# Patient Record
Sex: Male | Born: 1960 | Race: White | Hispanic: No | Marital: Married | Smoking: Never smoker
Health system: Southern US, Community
[De-identification: ages and names within clinical notes are randomized; demographics above are authoritative.]

## PROBLEM LIST (undated history)

## (undated) DIAGNOSIS — T7840XA Allergy, unspecified, initial encounter: Secondary | ICD-10-CM

## (undated) DIAGNOSIS — R945 Abnormal results of liver function studies: Secondary | ICD-10-CM

## (undated) DIAGNOSIS — C44300 Unspecified malignant neoplasm of skin of unspecified part of face: Secondary | ICD-10-CM

## (undated) DIAGNOSIS — R202 Paresthesia of skin: Secondary | ICD-10-CM

## (undated) DIAGNOSIS — I10 Essential (primary) hypertension: Secondary | ICD-10-CM

## (undated) DIAGNOSIS — S0291XA Unspecified fracture of skull, initial encounter for closed fracture: Secondary | ICD-10-CM

## (undated) DIAGNOSIS — E7800 Pure hypercholesterolemia, unspecified: Secondary | ICD-10-CM

## (undated) DIAGNOSIS — F32A Depression, unspecified: Secondary | ICD-10-CM

## (undated) DIAGNOSIS — G4730 Sleep apnea, unspecified: Secondary | ICD-10-CM

## (undated) DIAGNOSIS — M1990 Unspecified osteoarthritis, unspecified site: Secondary | ICD-10-CM

## (undated) HISTORY — DX: Essential (primary) hypertension: I10

## (undated) HISTORY — DX: Allergy, unspecified, initial encounter: T78.40XA

## (undated) HISTORY — DX: Paresthesia of skin: R20.2

## (undated) HISTORY — DX: Unspecified osteoarthritis, unspecified site: M19.90

## (undated) HISTORY — DX: Depression, unspecified: F32.A

## (undated) HISTORY — PX: VASECTOMY: SHX75

## (undated) HISTORY — PX: FRACTURE SURGERY: SHX138

---

## 2001-10-30 HISTORY — PX: TIBIA FRACTURE SURGERY: SHX806

## 2002-11-02 ENCOUNTER — Inpatient Hospital Stay (HOSPITAL_COMMUNITY): Admission: EM | Admit: 2002-11-02 | Discharge: 2002-11-05 | Payer: Self-pay | Admitting: Emergency Medicine

## 2002-11-03 ENCOUNTER — Encounter: Payer: Self-pay | Admitting: Orthopedic Surgery

## 2010-02-04 ENCOUNTER — Ambulatory Visit: Payer: Self-pay | Admitting: Diagnostic Radiology

## 2010-02-04 ENCOUNTER — Encounter: Payer: Self-pay | Admitting: Emergency Medicine

## 2010-02-05 ENCOUNTER — Inpatient Hospital Stay (HOSPITAL_COMMUNITY): Admission: EM | Admit: 2010-02-05 | Discharge: 2010-02-06 | Payer: Self-pay | Admitting: Emergency Medicine

## 2011-01-18 LAB — DIFFERENTIAL
Basophils Absolute: 0.1 10*3/uL (ref 0.0–0.1)
Basophils Relative: 1 % (ref 0–1)
Eosinophils Absolute: 0 10*3/uL (ref 0.0–0.7)
Eosinophils Relative: 0 % (ref 0–5)
Lymphocytes Relative: 13 % (ref 12–46)
Lymphs Abs: 1.5 10*3/uL (ref 0.7–4.0)
Monocytes Absolute: 0.5 10*3/uL (ref 0.1–1.0)
Monocytes Relative: 4 % (ref 3–12)
Neutro Abs: 9.4 10*3/uL — ABNORMAL HIGH (ref 1.7–7.7)
Neutrophils Relative %: 82 % — ABNORMAL HIGH (ref 43–77)

## 2011-01-18 LAB — CBC
HCT: 42.5 % (ref 39.0–52.0)
Hemoglobin: 14.3 g/dL (ref 13.0–17.0)
MCHC: 33.7 g/dL (ref 30.0–36.0)
MCV: 89.8 fL (ref 78.0–100.0)
Platelets: 239 10*3/uL (ref 150–400)
RBC: 4.73 MIL/uL (ref 4.22–5.81)
RDW: 11.7 % (ref 11.5–15.5)
WBC: 11.4 10*3/uL — ABNORMAL HIGH (ref 4.0–10.5)

## 2011-01-18 LAB — BASIC METABOLIC PANEL
BUN: 16 mg/dL (ref 6–23)
CO2: 26 mEq/L (ref 19–32)
Calcium: 8.4 mg/dL (ref 8.4–10.5)
Chloride: 104 mEq/L (ref 96–112)
Creatinine, Ser: 1 mg/dL (ref 0.4–1.5)
GFR calc Af Amer: >60 mL/min (ref >60)
GFR calc non Af Amer: >60 mL/min (ref >60)
Glucose, Bld: 135 mg/dL — ABNORMAL HIGH (ref 70–99)
Potassium: 4.1 mEq/L (ref 3.5–5.1)
Sodium: 145 mEq/L (ref 135–145)

## 2013-08-24 ENCOUNTER — Ambulatory Visit (INDEPENDENT_AMBULATORY_CARE_PROVIDER_SITE_OTHER): Payer: 59 | Admitting: Family Medicine

## 2013-08-24 ENCOUNTER — Ambulatory Visit: Payer: 59

## 2013-08-24 VITALS — BP 130/80 | HR 77 | Temp 98.3°F | Resp 18 | Ht 67.5 in | Wt 272.0 lb

## 2013-08-24 DIAGNOSIS — Z131 Encounter for screening for diabetes mellitus: Secondary | ICD-10-CM

## 2013-08-24 DIAGNOSIS — M25551 Pain in right hip: Secondary | ICD-10-CM

## 2013-08-24 DIAGNOSIS — M1610 Unilateral primary osteoarthritis, unspecified hip: Secondary | ICD-10-CM

## 2013-08-24 DIAGNOSIS — M25559 Pain in unspecified hip: Secondary | ICD-10-CM

## 2013-08-24 DIAGNOSIS — R200 Anesthesia of skin: Secondary | ICD-10-CM

## 2013-08-24 DIAGNOSIS — R209 Unspecified disturbances of skin sensation: Secondary | ICD-10-CM

## 2013-08-24 LAB — POCT GLYCOSYLATED HEMOGLOBIN (HGB A1C): Hemoglobin A1C: 5.9

## 2013-08-24 MED ORDER — MELOXICAM 15 MG PO TABS: 15.0000 mg | ORAL_TABLET | Freq: Every day | ORAL | Status: DC

## 2013-08-24 NOTE — Progress Notes (Signed)
[facility]:  Office Visit  Chief Complaint:  Chief Complaint  Patient presents with  . Hip Pain    (R) hip ongoing x 1 year    HPI: Rick Stokes is a 52 y.o. male who is here for right hip pain x 1 year, near groin area, may have been triggered or gotten worse after digging tree in backyard about 1 year ago. He has pain when puts weight on it, if he moves a certain position, epecially when he crosses his right leg over the other one. IT can be a sharp pain when it happens, 7-8/10. He really has not tried anything for this. He has had no prior injuries. There was really no known injury to the hip that he knows of. He did not have any hip problems as a child He does not take chronic steroids, he has had no prior hip injuries. However in 2003-2004, he had a Right tibia and fibula compound fracture, rods and screws were put in after an accident when he fell from the top of a truck to the truck bed/bottome of it.  Denies any back or knee pain currently. He has no weakness, he does have some numbness and tingling in his hands. They are usually just the middle 3 fingers, when he sleeps mostly . He does not get them during the day. He works with his hands a lot. He denies prior h/o carpal tunnel syndrome, denies neck pain or shoulder pain Denies diabetes  Past Medical History  Diagnosis Date  . Allergy   . Arthritis   . Cancer     Skin  . Hypertension    Past Surgical History  Procedure Laterality Date  . Vasectomy    . Fracture surgery     History   Social History  . Marital Status: Married    Spouse Name: N/A    Number of Children: N/A  . Years of Education: N/A   Social History Main Topics  . Smoking status: Never Smoker   . Smokeless tobacco: None  . Alcohol Use: 7.0 oz/week    14 drink(s) per week  . Drug Use: No  . Sexual Activity: None   Other Topics Concern  . None   Social History Narrative  . None   Family History  Problem Relation Age  of Onset  . Bone cancer Mother   . Heart attack Father   . Dementia Maternal Grandmother    No Known Allergies Prior to Admission medications   Medication Sig Start Date End Date Taking? Authorizing Provider  fish oil-omega-3 fatty acids 1000 MG capsule Take 2 g by mouth daily.   Yes Historical Provider, MD  Garlic 500 MG CAPS Take by mouth.   Yes Historical Provider, MD     ROS: The patient denies fevers, chills, night sweats, unintentional weight loss, chest pain, palpitations, wheezing, dyspnea on exertion, nausea, vomiting, abdominal pain, dysuria, hematuria, melena,weakness  All other systems have been reviewed and were otherwise negative with the exception of those mentioned in the HPI and as above.    PHYSICAL EXAM: Filed Vitals:   08/24/13 0818  BP: 130/80  Pulse: 77  Temp: 98.3 F (36.8 C)  Resp: 18   Filed Vitals:   08/24/13 0818  Height: 5' 7.5" (1.715 m)  Weight: 272 lb (123.378 kg)   Body mass index is 41.95 kg/(m^2).  General: Alert, no acute distress HEENT:  Normocephalic, atraumatic, oropharynx patent. EOMI, PERRLA Cardiovascular:  Regular rate  and rhythm, no rubs murmurs or gallops.  No Carotid bruits, radial pulse intact. No pedal edema.  Respiratory: Clear to auscultation bilaterally.  No wheezes, rales, or rhonchi.  No cyanosis, no use of accessory musculature GI: No organomegaly, abdomen is soft and non-tender, positive bowel sounds.  No masses. Skin: No rashes. Neurologic: Facial musculature symmetric. Psychiatric: Patient is appropriate throughout our interaction. Lymphatic: No cervical lymphadenopathy Musculoskeletal: Gait intact. Neck-nl Back-nl Knee-nl Right hip- nl rom, pain with adduction, pain with full IR/ER 5/5 strenght, sensation intact, 2/2 DTRs knee and ankle + DP Wrist-+ tinels bilaterally, neg phalens, neg dequervain, full ROM, sensation intact, 5/5 strength   LABS: Results for orders placed in visit on 08/24/13  POCT  GLYCOSYLATED HEMOGLOBIN (HGB A1C)      Result Value Range   Hemoglobin A1C 5.9       EKG/XRAY:   Primary read interpreted by Dr. Conley Rolls at [facility]. + arthritis No fx or dislocation   ASSESSMENT/PLAN: Encounter Diagnoses  Name Primary?  . Right hip pain Yes  . Screening for diabetes mellitus   . Numbness and tingling in hands   . Hip arthritis    Rx Mobic  Weightloss NO DM for cause of hand numbness/tingling, maybe carpal tunnel realted Will await for official xrays, there are some incongruent arthritic changes and sclerotic areas on lateral femoral head F/u in 3 months or prn Gross sideeffects, risk and benefits, and alternatives of medications d/w patient. Patient is aware that all medications have potential sideeffects and we are unable to predict every sideeffect or drug-drug interaction that may occur.  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  This may be due to subacute remodeling / collapse or osteochondral  defect. No definite acute fracture. Clinical correlation is  necessary. Further correlation with MRI is recommended.

## 2013-08-24 NOTE — Patient Instructions (Signed)

## 2013-08-25 ENCOUNTER — Telehealth: Payer: Self-pay | Admitting: Family Medicine

## 2013-08-25 ENCOUNTER — Telehealth: Payer: Self-pay

## 2013-08-25 NOTE — Telephone Encounter (Signed)
Unable to leave voice mail at pt's home or cell phone. LM with wife's cell phone.  Appt for ortho with Dr. 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

## 2013-08-25 NOTE — Telephone Encounter (Signed)
Patient was seen by Dr Conley Rolls and was referred to Dr. Dion Saucier. They cancelled the appt because patient works out of town. Just wanted to let the Dr know so she wont be expecting office notes. Patient wife called.

## 2013-08-28 ENCOUNTER — Encounter: Payer: Self-pay | Admitting: Family Medicine

## 2014-10-30 DIAGNOSIS — R7989 Other specified abnormal findings of blood chemistry: Secondary | ICD-10-CM

## 2014-10-30 HISTORY — DX: Other specified abnormal findings of blood chemistry: R79.89

## 2014-11-08 ENCOUNTER — Ambulatory Visit (INDEPENDENT_AMBULATORY_CARE_PROVIDER_SITE_OTHER): Payer: 59

## 2014-11-08 ENCOUNTER — Other Ambulatory Visit: Payer: Self-pay | Admitting: Emergency Medicine

## 2014-11-08 ENCOUNTER — Ambulatory Visit (INDEPENDENT_AMBULATORY_CARE_PROVIDER_SITE_OTHER): Payer: 59 | Admitting: Emergency Medicine

## 2014-11-08 VITALS — BP 124/80 | HR 70 | Temp 98.2°F | Resp 18 | Ht 67.5 in | Wt 250.0 lb

## 2014-11-08 DIAGNOSIS — R9431 Abnormal electrocardiogram [ECG] [EKG]: Secondary | ICD-10-CM

## 2014-11-08 DIAGNOSIS — Z01818 Encounter for other preprocedural examination: Secondary | ICD-10-CM

## 2014-11-08 DIAGNOSIS — G4733 Obstructive sleep apnea (adult) (pediatric): Secondary | ICD-10-CM

## 2014-11-08 LAB — POCT UA - MICROSCOPIC ONLY
Bacteria, U Microscopic: NEGATIVE
CASTS, UR, LPF, POC: NEGATIVE
CRYSTALS, UR, HPF, POC: NEGATIVE
EPITHELIAL CELLS, URINE PER MICROSCOPY: NEGATIVE
RBC, URINE, MICROSCOPIC: NEGATIVE
Yeast, UA: NEGATIVE

## 2014-11-08 LAB — POCT CBC
Granulocyte percent: 63.4 %G (ref 37–80)
HEMATOCRIT: 46.6 % (ref 43.5–53.7)
HEMOGLOBIN: 15.8 g/dL (ref 14.1–18.1)
LYMPH, POC: 2 (ref 0.6–3.4)
MCH: 31.8 pg — AB (ref 27–31.2)
MCHC: 33.9 g/dL (ref 31.8–35.4)
MCV: 93.8 fL (ref 80–97)
MID (cbc): 0.4 (ref 0–0.9)
MPV: 7.1 fL (ref 0–99.8)
PLATELET COUNT, POC: 184 10*3/uL (ref 142–424)
POC Granulocyte: 4.2 (ref 2–6.9)
POC LYMPH %: 29.9 % (ref 10–50)
POC MID %: 6.7 % (ref 0–12)
RBC: 4.96 M/uL (ref 4.69–6.13)
RDW, POC: 12.3 %
WBC: 6.6 10*3/uL (ref 4.6–10.2)

## 2014-11-08 LAB — COMPLETE METABOLIC PANEL WITH GFR
ALBUMIN: 4.3 g/dL (ref 3.5–5.2)
ALK PHOS: 55 U/L (ref 39–117)
ALT: 86 U/L — AB (ref 0–53)
AST: 63 U/L — AB (ref 0–37)
BUN: 11 mg/dL (ref 6–23)
CO2: 25 mEq/L (ref 19–32)
Calcium: 9.4 mg/dL (ref 8.4–10.5)
Chloride: 102 mEq/L (ref 96–112)
Creat: 0.91 mg/dL (ref 0.50–1.35)
GFR, Est African American: >89 mL/min
GFR, Est Non African American: >89 mL/min
Glucose, Bld: 103 mg/dL — ABNORMAL HIGH (ref 70–99)
POTASSIUM: 4.2 meq/L (ref 3.5–5.3)
Sodium: 138 mEq/L (ref 135–145)
Total Bilirubin: 1 mg/dL (ref 0.2–1.2)
Total Protein: 7.2 g/dL (ref 6.0–8.3)

## 2014-11-08 LAB — POCT URINALYSIS DIPSTICK
Bilirubin, UA: NEGATIVE
Blood, UA: NEGATIVE
Glucose, UA: NEGATIVE
LEUKOCYTES UA: NEGATIVE
Nitrite, UA: NEGATIVE
PH UA: 6
PROTEIN UA: NEGATIVE
Spec Grav, UA: 1.02
UROBILINOGEN UA: 0.2

## 2014-11-08 LAB — PREALBUMIN: Prealbumin: 39.1 mg/dL — ABNORMAL HIGH (ref 17.0–34.0)

## 2014-11-08 NOTE — Progress Notes (Addendum)
Subjective:  This chart was scribed for Nena Jordan, MD by Dellis Filbert, ED Scribe at [facility].The patient was seen in exam room 13 and the patient's care was started at 8:33 AM.   Patient ID: Rick Stokes, male    DOB: 04-Jan-1961, 54 y.o.   MRN: [id number] Chief Complaint  Patient presents with  . Medical Clearance    Needs pre-op clearance for hip replacement    HPI HPI Comments: Rick Stokes is a 54 y.o. male who presents to [facility] for surgical medical clearance. He will have right hip surgery in [place], [place] at [facility], referred by a co-worker. Pt lives in [place], does have a PCP. Pt only takes Voltaren. Seen here by Dr. Marin Comment who discovered his right hip problem. Seen by one orthopedist here in [facility] and told he will need hip replacement. Had surgery before with no problems.   There are no active problems to display for this patient.  Past Medical History  Diagnosis Date  . Allergy   . Arthritis   . Cancer     Skin  . Hypertension    Past Surgical History  Procedure Laterality Date  . Vasectomy    . Fracture surgery     No Known Allergies Prior to Admission medications   Medication Sig Start Date End Date Taking? Authorizing Provider  diclofenac (VOLTAREN) 75 MG EC tablet Take 75 mg by mouth daily.   Yes Historical Provider, MD  fish oil-omega-3 fatty acids 1000 MG capsule Take 2 g by mouth daily.   Yes Historical Provider, MD  Garlic 254 MG CAPS Take by mouth.   Yes Historical Provider, MD  meloxicam (MOBIC) 15 MG tablet Take 1 tablet (15 mg total) by mouth daily. Patient not taking: Reported on 11/08/2014 08/24/13   Glenford Bayley, DO   History   Social History  . Marital Status: Married    Spouse Name: N/A    Number of Children: N/A  . Years of Education: N/A   Occupational History  . Not on file.   Social History Main Topics  . Smoking status: Never Smoker   . Smokeless tobacco: Not on file  . Alcohol Use: 7.0  oz/week    14 drink(s) per week  . Drug Use: No  . Sexual Activity: Not on file   Other Topics Concern  . Not on file   Social History Narrative     Review of Systems     Objective:  BP 124/80 mmHg  Pulse 70  Temp(Src) 98.2 F (36.8 C) (Oral)  Resp 18  Ht 5' 7.5" (1.715 m)  Wt 250 lb (113.399 kg)  BMI 38.56 kg/m2  SpO2 94%  Physical Exam  Constitutional: He is oriented to person, place, and time. He appears well-developed and well-nourished.  HENT:  Head: Normocephalic and atraumatic.  Eyes: EOM are normal.  Neck: Normal range of motion.  Cardiovascular: Normal rate.   Pulmonary/Chest: Effort normal.  Musculoskeletal: Normal range of motion.  Neurological: He is alert and oriented to person, place, and time.  Skin: Skin is warm and dry.  Psychiatric: He has a normal mood and affect. His behavior is normal.  Nursing note and vitals reviewed. [facility] reading (PRIMARY) by  Dr Everlene Farrier no acute disease on chest x-ray Results for orders placed or performed in visit on 11/08/14  POCT CBC  Result Value Ref Range   WBC 6.6 4.6 - 10.2 K/uL   Lymph, poc 2.0  0.6 - 3.4   POC LYMPH PERCENT 29.9 10 - 50 %L   MID (cbc) 0.4 0 - 0.9   POC MID % 6.7 0 - 12 %M   POC Granulocyte 4.2 2 - 6.9   Granulocyte percent 63.4 37 - 80 %G   RBC 4.96 4.69 - 6.13 M/uL   Hemoglobin 15.8 14.1 - 18.1 g/dL   HCT, POC 46.6 43.5 - 53.7 %   MCV 93.8 80 - 97 fL   MCH, POC 31.8 (A) 27 - 31.2 pg   MCHC 33.9 31.8 - 35.4 g/dL   RDW, POC 12.3 %   Platelet Count, POC 184 142 - 424 K/uL   MPV 7.1 0 - 99.8 fL  POCT urinalysis dipstick  Result Value Ref Range   Color, UA orange    Clarity, UA clear    Glucose, UA neg    Bilirubin, UA neg    Ketones, UA trace    Spec Grav, UA 1.020    Blood, UA neg    pH, UA 6.0    Protein, UA neg    Urobilinogen, UA 0.2    Nitrite, UA neg    Leukocytes, UA Negative   POCT UA - Microscopic Only  Result Value Ref Range   WBC, Ur, HPF, POC 0-3    RBC, urine,  microscopic neg    Bacteria, U Microscopic neg    Mucus, UA trace    Epithelial cells, urine per micros neg    Crystals, Ur, HPF, POC neg    Casts, Ur, LPF, POC neg    Yeast, UA neg    EKG  Q waves 3 and AVF [facility] reading (PRIMARY) by  Dr. Everlene Farrier NAD       Assessment & Plan:  Patient is in relatively good health except for his osteoarthritis. Labs, x-rays and EKGs as requested by his orthopedist have been done. Will checks immunizations and update as necessary. HE does have a history of sleep apnea patient declines a flu shot he is up-to-date on tetanus. He needs to be cautioned regarding his diagnosis of sleep apnea. He is referred to cardiology to evaluate the Q waves that are present in 3 and aVF.I personally performed the services described in this documentation, which was scribed in my presence. The recorded information has been reviewed and is accurate.

## 2014-11-08 NOTE — Progress Notes (Deleted)
   Subjective:    Patient ID: Rick Stokes, male    DOB: 1960-12-15, 54 y.o.   MRN: [id number]  HPI    Review of Systems     Objective:   Physical Exam        Assessment & Plan:

## 2014-11-09 LAB — HEPATITIS B SURFACE ANTIGEN: HEP B S AG: NEGATIVE

## 2014-11-09 LAB — HEPATITIS C ANTIBODY: HCV Ab: NEGATIVE

## 2014-11-09 LAB — VITAMIN D 25 HYDROXY (VIT D DEFICIENCY, FRACTURES): Vit D, 25-Hydroxy: 14 ng/mL — ABNORMAL LOW (ref 30–100)

## 2014-11-26 ENCOUNTER — Telehealth: Payer: Self-pay

## 2014-11-26 NOTE — Telephone Encounter (Signed)
Patient's Wife Joelene Millin called to request lab results. Joelene Millin states that she was told that she would receive results within three days and she hasn't been called. She also tried looking in mychart to access results but is having difficulties logging in. Please call Joelene Millin [phone]

## 2014-11-27 NOTE — Telephone Encounter (Signed)
LM for pt wife to call back and speak to the lab- results were given to pt on 1/20 per lab result notes.

## 2014-12-23 ENCOUNTER — Telehealth: Payer: Self-pay

## 2014-12-23 NOTE — Telephone Encounter (Signed)
Crystal from [facility] left voicemail requesting recent CXR for upcoming surgery. Please fax to [phone] and cb# is [phone].

## 2014-12-23 NOTE — Telephone Encounter (Signed)
Records faxed thru Epic.

## 2014-12-29 HISTORY — PX: PARTIAL HIP ARTHROPLASTY: SHX733

## 2015-05-05 ENCOUNTER — Observation Stay (HOSPITAL_COMMUNITY)
Admission: EM | Admit: 2015-05-05 | Discharge: 2015-05-05 | Disposition: A | Discharge status: Home / Self Care | Payer: 59 | Attending: Cardiology | Admitting: Cardiology

## 2015-05-05 ENCOUNTER — Emergency Department (HOSPITAL_COMMUNITY): Payer: 59

## 2015-05-05 ENCOUNTER — Encounter (HOSPITAL_COMMUNITY): Payer: Self-pay | Admitting: General Practice

## 2015-05-05 ENCOUNTER — Encounter (HOSPITAL_COMMUNITY)
Admission: EM | Disposition: A | Discharge status: Home / Self Care | Payer: 59 | Source: Home / Self Care | Attending: Emergency Medicine

## 2015-05-05 ENCOUNTER — Ambulatory Visit (INDEPENDENT_AMBULATORY_CARE_PROVIDER_SITE_OTHER): Payer: 59 | Admitting: Emergency Medicine

## 2015-05-05 ENCOUNTER — Encounter: Payer: Self-pay | Admitting: Emergency Medicine

## 2015-05-05 VITALS — BP 120/80 | HR 80 | Resp 16

## 2015-05-05 DIAGNOSIS — Z7982 Long term (current) use of aspirin: Secondary | ICD-10-CM | POA: Diagnosis not present

## 2015-05-05 DIAGNOSIS — R079 Chest pain, unspecified: Secondary | ICD-10-CM

## 2015-05-05 DIAGNOSIS — I10 Essential (primary) hypertension: Secondary | ICD-10-CM | POA: Insufficient documentation

## 2015-05-05 DIAGNOSIS — Z85828 Personal history of other malignant neoplasm of skin: Secondary | ICD-10-CM | POA: Diagnosis not present

## 2015-05-05 DIAGNOSIS — E669 Obesity, unspecified: Secondary | ICD-10-CM | POA: Insufficient documentation

## 2015-05-05 DIAGNOSIS — E785 Hyperlipidemia, unspecified: Secondary | ICD-10-CM | POA: Insufficient documentation

## 2015-05-05 DIAGNOSIS — Z8249 Family history of ischemic heart disease and other diseases of the circulatory system: Secondary | ICD-10-CM | POA: Diagnosis not present

## 2015-05-05 DIAGNOSIS — R0789 Other chest pain: Secondary | ICD-10-CM | POA: Diagnosis not present

## 2015-05-05 DIAGNOSIS — R7989 Other specified abnormal findings of blood chemistry: Secondary | ICD-10-CM | POA: Diagnosis not present

## 2015-05-05 DIAGNOSIS — R9431 Abnormal electrocardiogram [ECG] [EKG]: Secondary | ICD-10-CM

## 2015-05-05 DIAGNOSIS — I200 Unstable angina: Secondary | ICD-10-CM | POA: Diagnosis not present

## 2015-05-05 DIAGNOSIS — I249 Acute ischemic heart disease, unspecified: Secondary | ICD-10-CM | POA: Diagnosis present

## 2015-05-05 HISTORY — PX: CARDIAC CATHETERIZATION: SHX172

## 2015-05-05 HISTORY — DX: Unspecified malignant neoplasm of skin of unspecified part of face: C44.300

## 2015-05-05 HISTORY — DX: Unspecified fracture of skull, initial encounter for closed fracture: S02.91XA

## 2015-05-05 HISTORY — DX: Pure hypercholesterolemia, unspecified: E78.00

## 2015-05-05 HISTORY — DX: Sleep apnea, unspecified: G47.30

## 2015-05-05 HISTORY — DX: Abnormal results of liver function studies: R94.5

## 2015-05-05 LAB — COMPREHENSIVE METABOLIC PANEL
ALBUMIN: 4.2 g/dL (ref 3.5–5.0)
ALT: 53 U/L (ref 17–63)
AST: 55 U/L — AB (ref 15–41)
Alkaline Phosphatase: 55 U/L (ref 38–126)
Anion gap: 11 (ref 5–15)
BILIRUBIN TOTAL: 1.1 mg/dL (ref 0.3–1.2)
BUN: 10 mg/dL (ref 6–20)
CALCIUM: 9.9 mg/dL (ref 8.9–10.3)
CHLORIDE: 100 mmol/L — AB (ref 101–111)
CO2: 28 mmol/L (ref 22–32)
CREATININE: 0.99 mg/dL (ref 0.61–1.24)
GFR calc Af Amer: >60 mL/min (ref >60)
GFR calc non Af Amer: >60 mL/min (ref >60)
Glucose, Bld: 115 mg/dL — ABNORMAL HIGH (ref 65–99)
Potassium: 4.4 mmol/L (ref 3.5–5.1)
Sodium: 139 mmol/L (ref 135–145)
TOTAL PROTEIN: 7.7 g/dL (ref 6.5–8.1)

## 2015-05-05 LAB — CBC
HEMATOCRIT: 46.7 % (ref 39.0–52.0)
HEMOGLOBIN: 16.1 g/dL (ref 13.0–17.0)
MCH: 31.6 pg (ref 26.0–34.0)
MCHC: 34.5 g/dL (ref 30.0–36.0)
MCV: 91.7 fL (ref 78.0–100.0)
Platelets: 172 10*3/uL (ref 150–400)
RBC: 5.09 MIL/uL (ref 4.22–5.81)
RDW: 12.6 % (ref 11.5–15.5)
WBC: 10.5 10*3/uL (ref 4.0–10.5)

## 2015-05-05 LAB — D-DIMER, QUANTITATIVE: D-Dimer, Quant: 0.94 ug/mL-FEU — ABNORMAL HIGH (ref 0.00–0.48)

## 2015-05-05 LAB — TROPONIN I: Troponin I: <0.03 ng/mL (ref <0.031)

## 2015-05-05 LAB — PROTIME-INR
INR: 1.06 (ref 0.00–1.49)
PROTHROMBIN TIME: 14 s (ref 11.6–15.2)

## 2015-05-05 LAB — I-STAT TROPONIN, ED: TROPONIN I, POC: 0 ng/mL (ref 0.00–0.08)

## 2015-05-05 SURGERY — LEFT HEART CATH AND CORONARY ANGIOGRAPHY
Anesthesia: LOCAL

## 2015-05-05 MED ORDER — MORPHINE SULFATE 2 MG/ML IJ SOLN
2.0000 mg | Freq: Once | INTRAMUSCULAR | Status: AC
  Administered 2015-05-05: 2 mg via INTRAVENOUS
  Filled 2015-05-05: qty 1

## 2015-05-05 MED ORDER — NITROGLYCERIN 0.4 MG SL SUBL
0.4000 mg | SUBLINGUAL_TABLET | SUBLINGUAL | Status: DC | PRN
Start: 2015-05-05 — End: 2015-05-05
  Administered 2015-05-05 (×2): 0.4 mg via SUBLINGUAL
  Filled 2015-05-05: qty 1

## 2015-05-05 MED ORDER — NITROGLYCERIN 1 MG/10 ML FOR IR/CATH LAB
INTRA_ARTERIAL | Status: AC
  Filled 2015-05-05: qty 10

## 2015-05-05 MED ORDER — ACETAMINOPHEN 325 MG PO TABS: 650.0000 mg | ORAL_TABLET | ORAL | Status: DC | PRN

## 2015-05-05 MED ORDER — HEPARIN (PORCINE) IN NACL 100-0.45 UNIT/ML-% IJ SOLN
1300.0000 [IU]/h | INTRAMUSCULAR | Status: DC
  Filled 2015-05-05: qty 250

## 2015-05-05 MED ORDER — MIDAZOLAM HCL 2 MG/2ML IJ SOLN
INTRAMUSCULAR | Status: AC
  Filled 2015-05-05: qty 2

## 2015-05-05 MED ORDER — VERAPAMIL HCL 2.5 MG/ML IV SOLN
INTRAVENOUS | Status: DC | PRN
  Administered 2015-05-05: 11:00:00 via INTRA_ARTERIAL

## 2015-05-05 MED ORDER — HEPARIN BOLUS VIA INFUSION
4000.0000 [IU] | Freq: Once | INTRAVENOUS | Status: DC
  Filled 2015-05-05: qty 4000

## 2015-05-05 MED ORDER — NITROGLYCERIN IN D5W 200-5 MCG/ML-% IV SOLN
0.0000 ug/min | Freq: Once | INTRAVENOUS | Status: AC
  Administered 2015-05-05: 5 ug/min via INTRAVENOUS
  Filled 2015-05-05: qty 250

## 2015-05-05 MED ORDER — SODIUM CHLORIDE 0.9 % IJ SOLN: 3.0000 mL | Freq: Two times a day (BID) | INTRAMUSCULAR | Status: DC

## 2015-05-05 MED ORDER — SODIUM CHLORIDE 0.9 % WEIGHT BASED INFUSION: 3.0000 mL/kg/h | INTRAVENOUS | Status: AC

## 2015-05-05 MED ORDER — HEPARIN SODIUM (PORCINE) 1000 UNIT/ML IJ SOLN
INTRAMUSCULAR | Status: DC | PRN
  Administered 2015-05-05: 4000 [IU] via INTRAVENOUS

## 2015-05-05 MED ORDER — IOHEXOL 350 MG/ML SOLN
INTRAVENOUS | Status: DC | PRN
  Administered 2015-05-05: 100 mL via INTRA_ARTERIAL

## 2015-05-05 MED ORDER — FENTANYL CITRATE (PF) 100 MCG/2ML IJ SOLN
INTRAMUSCULAR | Status: AC
  Filled 2015-05-05: qty 2

## 2015-05-05 MED ORDER — SODIUM CHLORIDE 0.9 % IJ SOLN: 3.0000 mL | INTRAMUSCULAR | Status: DC | PRN

## 2015-05-05 MED ORDER — HEPARIN SODIUM (PORCINE) 1000 UNIT/ML IJ SOLN
INTRAMUSCULAR | Status: AC
  Filled 2015-05-05: qty 1

## 2015-05-05 MED ORDER — OMEPRAZOLE 20 MG PO CPDR: 20.0000 mg | DELAYED_RELEASE_CAPSULE | Freq: Every day | ORAL | Status: DC

## 2015-05-05 MED ORDER — PANTOPRAZOLE SODIUM 40 MG PO TBEC
40.0000 mg | DELAYED_RELEASE_TABLET | Freq: Every day | ORAL | Status: DC
  Administered 2015-05-05: 40 mg via ORAL
  Filled 2015-05-05: qty 1

## 2015-05-05 MED ORDER — LIDOCAINE HCL (PF) 1 % IJ SOLN
INTRAMUSCULAR | Status: DC | PRN
  Administered 2015-05-05: 12:00:00

## 2015-05-05 MED ORDER — MIDAZOLAM HCL 2 MG/2ML IJ SOLN
INTRAMUSCULAR | Status: DC | PRN
  Administered 2015-05-05: 2 mg via INTRAVENOUS

## 2015-05-05 MED ORDER — LIDOCAINE HCL (PF) 1 % IJ SOLN
INTRAMUSCULAR | Status: AC
  Filled 2015-05-05: qty 30

## 2015-05-05 MED ORDER — FENTANYL CITRATE (PF) 100 MCG/2ML IJ SOLN
INTRAMUSCULAR | Status: DC | PRN
  Administered 2015-05-05: 25 ug via INTRAVENOUS

## 2015-05-05 MED ORDER — SODIUM CHLORIDE 0.9 % IV SOLN
20.0000 mL | INTRAVENOUS | Status: DC
  Administered 2015-05-05: 20 mL via INTRAVENOUS

## 2015-05-05 MED ORDER — LIDOCAINE HCL (PF) 1 % IJ SOLN
INTRAMUSCULAR | Status: DC | PRN
  Administered 2015-05-05: 5 mL via INTRADERMAL

## 2015-05-05 MED ORDER — HEPARIN (PORCINE) IN NACL 2-0.9 UNIT/ML-% IJ SOLN
INTRAMUSCULAR | Status: AC
  Filled 2015-05-05: qty 1000

## 2015-05-05 MED ORDER — ONDANSETRON HCL 4 MG/2ML IJ SOLN: 4.0000 mg | Freq: Four times a day (QID) | INTRAMUSCULAR | Status: DC | PRN

## 2015-05-05 MED ORDER — HEPARIN (PORCINE) IN NACL 2-0.9 UNIT/ML-% IJ SOLN
INTRAMUSCULAR | Status: AC
  Filled 2015-05-05: qty 500

## 2015-05-05 MED ORDER — SODIUM CHLORIDE 0.9 % IV SOLN: 250.0000 mL | INTRAVENOUS | Status: DC | PRN

## 2015-05-05 SURGICAL SUPPLY — 14 items

## 2015-05-05 NOTE — Discharge Summary (Signed)
Discharge Summary   Patient ID: Rick Stokes MRN: [id number], DOB/AGE: [birth year] 54 y.o. Admit date: 05/05/2015 D/C date:     05/05/2015  Primary Care Provider: No primary care provider on file. Primary Cardiologist: Seen by Dr. Marlou Porch  Primary Discharge Diagnoses:  1. Noncardiac chest pain, question GI etiology (?GERD versus esophageal spasm)  Secondary Discharge Diagnoses:  1. H/o allergy 2. H/o arthritis 3. H/o skin cancer 4. H/o hypertension in chart, but BP controlled in hospital here 5. H/o elevated LFTs in 10/2014  Hospital Course: Mr. Taft is a 54 y/o M with family history of CAD and hyperlipidemia who presented to urgent care center earlier this morning with complaints of chest discomfort. Father had MI at 79. The patient was at work running heavy machinery and developed 8/10 chest pain/pressure without significant shortness of breath noted. His chest pain was pressure-like for approximately 2 hours slightly waxing and waning. It seemed to be worsened with deep breathing/inspiration but not always. No radiation of pain. His pain was improved upon coming to the emergency room and then he had another bout while sitting in the emergency room that was accompanied with diaphoresis and suggestion of worsening ST segment elevation inferior leads, J-point, most notably in lead 3. His prior EKG from January 2016 showed no evidence of J-point elevation. It was unclear if this represented ischemia versus changes from early repolizarization changes. He had no recent fevers, coughs, chills, nausea, vomiting, melena, or diarrhea. He wakes up quite early in the morning to go to work at approximately 5 AM and drank water/milk which is normal for him. Initial troponins were negative x2. CBC normal. CMET showed glucose 115, AST 55, otherwise OK. (H/o elevated LFTs in 10/2014 so the mildly elevated AST was not an acute change.) Cardiac cath was performed showing no evidence of CAD (minimal proximal LAD  calcium), EF 60%. This was felt reassuring. Dr. Marlou Porch wondered if chest pain was inflammatory process versus GI process. He recommended to begin PPI and follow up with PCP. Of note D-dimer was sent by ER and was 0.94 but the patient was felt low risk - Wells Criteria was 0. He had not had any dyspnea. He was not tachycardic, tachypneic or hypoxic. The patient ambulated post-cath, remained stable, and did not desat. Dr. Marlou Porch did not feel any further workup was needed for this. The patient has remained chest pain free throughout the afternoon. Dr. Marlou Porch has seen and examined the patient today and feels he is stable for discharge. He felt the patient would return to work but with standard post-cath restrictions of lifting (see d/c instructions below).  The patient was instructed to f/u PCP for future monitoring of risk factors, including mild hyperglycemia noted this admission as well as to follow cholesterol status.    Discharge Vitals: Blood pressure 132/86, pulse 83, temperature 98.4 F (36.9 C), temperature source Oral, resp. rate 16, height 5' 7.32" (1.71 m), weight 250 lb (113.4 kg), SpO2 96 %.  Labs: Lab Results  Component Value Date   WBC 10.5 05/05/2015   HGB 16.1 05/05/2015   HCT 46.7 05/05/2015   MCV 91.7 05/05/2015   PLT 172 05/05/2015    Recent Labs Lab 05/05/15 0936  NA 139  K 4.4  CL 100*  CO2 28  BUN 10  CREATININE 0.99  CALCIUM 9.9  PROT 7.7  BILITOT 1.1  ALKPHOS 55  ALT 53  AST 55*  GLUCOSE 115*    Recent Labs  05/05/15 1058  TROPONINI <0.03    Lab Results  Component Value Date   DDIMER 0.94* 05/05/2015    Diagnostic Studies/Procedures   Dg Chest Portable 1 View  05/05/2015   CLINICAL DATA:  Chest pain, history hypertension  EXAM: PORTABLE CHEST - 1 VIEW  COMPARISON:  Portable exam 0160 hours compared to 11/08/2014  FINDINGS: Normal heart size, mediastinal contours and pulmonary vascularity.  Lungs clear.  No pleural effusion or pneumothorax.  No  acute osseous findings.  IMPRESSION: No acute abnormalities.   Electronically Signed   By: Lavonia Dana M.D.   On: 05/05/2015 09:55   Cardiac catheterization this admission, please see full report and above for summary. (see Procedures)  Discharge Medications   Current Discharge Medication List    START taking these medications   Details  omeprazole (PRILOSEC) 20 MG capsule Take 1 capsule (20 mg total) by mouth daily. Qty: 30 capsule, Refills: 0      CONTINUE these medications which have NOT CHANGED   Details  aspirin EC 81 MG tablet Take 81 mg by mouth daily.    CALCIUM PO Take 1 tablet by mouth daily.    Cholecalciferol (VITAMIN D) 2000 UNITS CAPS Take 1 capsule by mouth daily.    fish oil-omega-3 fatty acids 1000 MG capsule Take 1 g by mouth daily.     GARLIC PO Take 4-5 tablets by mouth daily.    Multiple Vitamin (MULTIVITAMIN WITH MINERALS) TABS tablet Take 1 tablet by mouth daily.      STOP taking these medications     diclofenac (VOLTAREN) 75 MG EC tablet - patient was not taking prior to admission      meloxicam (MOBIC) 15 MG tablet - patient was not taking prior to admission         Disposition   The patient will be discharged in stable condition to home. Discharge Instructions    Diet - low sodium heart healthy    Complete by:  As directed      Increase activity slowly    Complete by:  As directed   No driving for 1 day. No lifting over 5 lbs for 1 week. No sexual activity for 1 week. You may return to work tomorrow if you are feeling well, with the above lifting restriction. Keep procedure site clean & dry. If you notice increased pain, swelling, bleeding or pus, call/return!  You may shower, but no soaking baths/hot tubs/pools for 1 week.   Your doctor has recommended you start a trial of omeprazole to see if this prevents further episodes of chest discomfort. This is an stomach acid controlling medicine.          Follow-up Information    Follow up with  Primary Care Provider.   Why:  Please follow up with primary care doctor to monitor your blood sugar levels and cholesterol. Your blood sugar was mildly elevated in the hospital.       Follow up with Candee Furbish, MD.   Specialty:  Cardiology   Why:  If you have any questions about your hospital stay or catheterization site, don't hesitate to call our office. You only need to follow-up as needed.   Contact information:   [address] [address] [phone]        Duration of Discharge Encounter: Greater than 30 minutes including physician and PA time.  Raechel Ache PA-C 05/05/2015, 12:58 PM

## 2015-05-05 NOTE — Progress Notes (Signed)
   Subjective:  This chart was scribed for Rick Queen, MD by Thea Alken, ED Scribe. This patient was seen in room 7 and the patient's care was started at 8:52 AM.  Patient ID: Rick Stokes, male    DOB: [birth year], 54 y.o.   MRN: [id number]  HPI  HPI Comments: Rick Stokes is a 54 y.o. male who presents to the [facility] complaining of CP. 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 He has not hx of CAD but does have family hx of CAD and hyperlipidemia. He takes 1 aspirin a day. He denies nausea, and no true SOB.  Past Medical History  Diagnosis Date  . Allergy   . Arthritis   . Cancer     Skin  . Hypertension    Past Surgical History  Procedure Laterality Date  . Vasectomy    . Fracture surgery     Prior to Admission medications   Medication Sig Start Date End Date Taking? Authorizing Provider  diclofenac (VOLTAREN) 75 MG EC tablet Take 75 mg by mouth daily.    Historical Provider, MD  fish oil-omega-3 fatty acids 1000 MG capsule Take 2 g by mouth daily.    Historical Provider, MD  Garlic 283 MG CAPS Take by mouth.    Historical Provider, MD  meloxicam (MOBIC) 15 MG tablet Take 1 tablet (15 mg total) by mouth daily. Patient not taking: Reported on 11/08/2014 08/24/13   Thao P Le, DO   Review of Systems  Respiratory: Negative for shortness of breath.   Cardiovascular: Positive for chest pain.  Gastrointestinal: Negative for nausea and vomiting.       Objective:   Physical Exam CONSTITUTIONAL: Well developed/well nourished. Appears flushed with diaphoresis. HEAD: Normocephalic/atraumatic EYES: EOMI/PERRL ENMT: Mucous membranes moist NECK: supple no meningeal signs SPINE/BACK:entire spine nontender CV: S1/S2 noted, no murmurs/rubs/gallops noted LUNGS: Lungs are clear to auscultation bilaterally, no apparent distress ABDOMEN: soft, nontender, no rebound or guarding, bowel sounds  noted throughout abdomen GU:no cva tenderness NEURO: Pt is awake/alert/appropriate, moves all extremitiesx4.  No facial droop.   EXTREMITIES: pulses normal/equal, full ROM. No calf tenderness of swelling. SKIN: warm, color normal PSYCH: no abnormalities of mood noted, alert and oriented to situation  Filed Vitals:   05/05/15 0850  BP: 120/80     EKG reading. No acute changes.  Assessment & Plan:    Pt presents with onset this morning at work of severe substernal CP. He states pain is worse when taking a breath. His EKG is normal. He started on O2 and given 4 baby aspirin, IV started left hand.  EMS called and taken to the hospital for further evaluation.

## 2015-05-05 NOTE — H&P (Addendum)
Admit date: 05/05/2015 Primary Physician  No primary care provider on file. Primary Cardiologist  New  CC: Chest pain  HPI: 54 year old with family history of CAD and hyperlipidemia who presented to urgent care center earlier this morning with complaints of chest discomfort. He was at work running heavy machinery and developed 8/10 chest pain, pressure. No significant shortness of breath noted. Father MI at age 71. His chest pain was pressure-like for approximately 2 hours slightly waxing and waning and seemed to be worsened with deep breathing/inspiration but not always. No radiation of pain. His pain was improved upon coming to the emergency room and then he had another bout while sitting in the emergency room that was accompanied with diaphoresis and worsening ST segment elevation inferior leads, J-point. Most notably in lead 3. His prior EKG from January 2016 showed no evidence of J-point elevation. He has had no recent fevers, coughs, chills, nausea, vomiting, melanoma, diarrhea. He wakes up quite early in the morning to go to work at approximately 5 AM and drank water/milk which is normal for him.  Multiple EKGs were reviewed and there are ST segment changes in the inferior leads but specifically, J-point noted mostly in lead 2 and 3 proximally 1 mm that are different from prior EKG from 11/08/14.      PMH:   Past Medical History  Diagnosis Date  . Allergy   . Arthritis   . Cancer     Skin  . Hypertension     PSH:   Past Surgical History  Procedure Laterality Date  . Vasectomy    . Fracture surgery     Allergies:  Review of patient's allergies indicates no known allergies. Prior to Admit Meds:   Prior to Admission medications   Medication Sig Start Date End Date Taking? Authorizing Provider  aspirin EC 81 MG tablet Take 81 mg by mouth daily.   Yes Historical Provider, MD  CALCIUM PO Take 1 tablet by mouth daily.   Yes Historical Provider, MD  Cholecalciferol (VITAMIN D)  2000 UNITS CAPS Take 1 capsule by mouth daily.   Yes Historical Provider, MD  fish oil-omega-3 fatty acids 1000 MG capsule Take 1 g by mouth daily.    Yes Historical Provider, MD  GARLIC PO Take 4-5 tablets by mouth daily.   Yes Historical Provider, MD  Multiple Vitamin (MULTIVITAMIN WITH MINERALS) TABS tablet Take 1 tablet by mouth daily.   Yes Historical Provider, MD  diclofenac (VOLTAREN) 75 MG EC tablet Take 75 mg by mouth daily.    Historical Provider, MD  meloxicam (MOBIC) 15 MG tablet Take 1 tablet (15 mg total) by mouth daily. Patient not taking: Reported on 11/08/2014 08/24/13   Thao P Le, DO   Fam HX:    Family History  Problem Relation Age of Onset  . Bone cancer Mother   . Heart attack Father   . Dementia Maternal Grandmother    Social HX:    History   Social History  . Marital Status: Married    Spouse Name: N/A  . Number of Children: N/A  . Years of Education: N/A   Occupational History  . Not on file.   Social History Main Topics  . Smoking status: Never Smoker   . Smokeless tobacco: Not on file  . Alcohol Use: 7.2 oz/week    12 Cans of beer per week  . Drug Use: No  . Sexual Activity: Not on file   Other Topics Concern  .  Not on file   Social History Narrative     ROS:  Denies any fevers, chills, syncope, bleeding, orthopnea, PND All 11 ROS were addressed and are negative except what is stated in the HPI   Physical Exam: Blood pressure 144/80, pulse 83, temperature 98.7 F (37.1 C), temperature source Oral, resp. rate 15, height 5' 7.32" (1.71 m), weight 250 lb (113.4 kg), SpO2 97 %.   General: Well developed, well nourished, in no acute distress Head: Eyes PERRLA, No xanthomas.   Normal cephalic and atramatic  Lungs:  Clear bilaterally to auscultation and percussion. Normal respiratory effort. No wheezes, no rales. Heart:  HRRR S1 S2 Pulses are 2+ & equal. No murmurs, rubs or gallops.             No carotid bruit. No JVD.  No abdominal  bruits. Abdomen: Bowel sounds are positive, abdomen soft and non-tender without masses or                 Hernia's noted. No hepatosplenomegaly. Overweight Msk:  Back normal, normal gait. Normal strength and tone for age. Extremities:  No clubbing, cyanosis or edema.  DP +1 Neuro: Alert and oriented X 3, non-focal, MAE x 4 GU: Deferred Rectal: Deferred Psych:  Good affect, responds appropriately         Labs:   Lab Results  Component Value Date   WBC 10.5 05/05/2015   HGB 16.1 05/05/2015   HCT 46.7 05/05/2015   MCV 91.7 05/05/2015   PLT 172 05/05/2015   No results for input(s): NA, K, CL, CO2, BUN, CREATININE, CALCIUM, PROT, BILITOT, ALKPHOS, ALT, AST, GLUCOSE in the last 168 hours.  Invalid input(s): LABALBU No results for input(s): CKTOTAL, CKMB, TROPONINI in the last 72 hours. No results found for: CHOL, HDL, LDLCALC, TRIG No results found for: DDIMER   Radiology:  Dg Chest Portable 1 View  05/05/2015   CLINICAL DATA:  Chest pain, history hypertension  EXAM: PORTABLE CHEST - 1 VIEW  COMPARISON:  Portable exam 0947 hours compared to 11/08/2014  FINDINGS: Normal heart size, mediastinal contours and pulmonary vascularity.  Lungs clear.  No pleural effusion or pneumothorax.  No acute osseous findings.  IMPRESSION: No acute abnormalities.   Electronically Signed   By: Lavonia Dana M.D.   On: 05/05/2015 09:55   Personally viewed.   EKG:  As described above with J-point elevation approximately 1 mm especially in inferior leads 2, 3, aVF as well as subtle elevation noted in V5 and V6 J-point. This seems to be accentuated from earlier EKG on 11/08/14. Personally viewed.  ASSESSMENT/PLAN:   54 year old with no prior cardiovascular history here with chest discomfort, J-point elevation ST segment elevation in inferior leads. Point-of-care troponin is 0. AST and ALT are mildly elevated. Increasing chest discomfort and emergency room accompanied with diaphoresis and worsening ST segment  change in lead 3, 2.  1. Acute coronary syndrome/unstable angina-EKG J-point elevation is concerning 1 coinciding with his history of lower chest wall pressure. Increasing diaphoresis in the emergency room with slightly dynamic EKG change. Father had myocardial infarction at 46. He takes an aspirin a day. We discussed possible pathways and we mutually agreed to proceed with cardiac catheterization via the right radial artery approach.Risks of stroke, MI, bleeding, renal impairment were discussed. He discussed also with his wife. Willing to proceed.  If coronary angiogram is normal, other possibilities include GI etiology. Low likelihood for pulmonary embolism. No shortness of breath.  Heparin and IV nitroglycerin were  ordered in the emergency department.  2. Obesity-encourage weight loss  3. Mildly elevated liver function enzymes-both AST and ALT are mildly elevated. Bilirubin is normal. We will continue to monitor.    Candee Furbish, MD  05/05/2015  10:11 AM

## 2015-05-05 NOTE — ED Notes (Signed)
Pt brought in via [facility] with complaints of chest pain. Pt had a sudden onset of chest pain located in his central chest, describing pain a 5/10. Pt had chest pain for approximately 2 hours and went to a local urgent care. When patient arrived chest pain was significantly lower at a 1. Pt received 1 ASA at urgent care. Pt is currently rating pain a 1/10, and reporting pain is worse inspiration and with palpation. Pt is A/O. Pt has a family history of cardiac issues.

## 2015-05-05 NOTE — ED Notes (Signed)
Repeat given to Dr.Ray.

## 2015-05-05 NOTE — Interval H&P Note (Signed)
Cath Lab Visit (complete for each Cath Lab visit)  Clinical Evaluation Leading to the Procedure:   ACS: Yes.    Non-ACS:    Anginal Classification: CCS II  Anti-ischemic medical therapy: No Therapy  Non-Invasive Test Results: No non-invasive testing performed  Prior CABG: No previous CABG         History and Physical Interval Note:  05/05/2015 11:05 AM  Rick Stokes  has presented today for surgery, with the diagnosis of cp  The various methods of treatment have been discussed with the patient and family. After consideration of risks, benefits and other options for treatment, the patient has consented to  Procedure(s): Left Heart Cath and Coronary Angiography (N/A) as a surgical intervention .  The patient's history has been reviewed, patient examined, no change in status, stable for surgery.  I have reviewed the patient's chart and labs.  Questions were answered to the patient's satisfaction.     SKAINS, MARK

## 2015-05-05 NOTE — Progress Notes (Signed)
ANTICOAGULATION CONSULT NOTE - Initial Consult  Pharmacy Consult:  Heparin Indication: chest pain/ACS  No Known Allergies  Patient Measurements: Height: 5' 7.32" (171 cm) Weight: 250 lb (113.4 kg) IBW/kg (Calculated) : 66.84 Heparin Dosing Weight: 93 kg  Vital Signs: Temp: 98.7 F (37.1 C) (07/06 0929) Temp Source: Oral (07/06 0929) BP: 144/80 mmHg (07/06 0929) Pulse Rate: 83 (07/06 0929)  Labs:  Recent Labs  05/05/15 0936  HGB 16.1  HCT 46.7  PLT 172    CrCl cannot be calculated (Patient has no serum creatinine result on file.).   Medical History: Past Medical History  Diagnosis Date  . Allergy   . Arthritis   . Cancer     Skin  . Hypertension        Assessment: 33 YOM presented with chest pain to start IV heparin for ACS.  Baseline labs reviewed.   Goal of Therapy:  Heparin level 0.3-0.7 units/ml Monitor platelets by anticoagulation protocol: Yes    Plan:  - Heparin 4000 units IV bolus x 1, then - Heparin gtt at 1300 units/hr - Check 6 hr HL - Daily HL / CBC    Aquila Delaughter D. Mina Marble, PharmD, BCPS Pager:  [phone] 05/05/2015, 10:53 AM

## 2015-05-05 NOTE — ED Notes (Signed)
Pt complaining of an increase in chest pain from a 1-6. Pt very diaphoretic. RN notified  MD. New orders given for Nitro drip, morphine, and heparin drip. Will continue to monitor.

## 2015-05-05 NOTE — ED Provider Notes (Signed)
CSN: [id number]     Arrival date & time 05/05/15  2778 History   First MD Initiated Contact with Patient 05/05/15 [phone]     Chief Complaint  Patient presents with  . Chest Pain     (Consider location/radiation/quality/duration/timing/severity/associated sxs/prior Treatment) HPI  Patient with chest pain sscp began at 0630 at work pressure worse with inspiration 5/10- continued to work for 2 hours the went to urgent care with pain spontaneously decreased to 1/10.  Patient had taken his usual aspirin today.  No other intervention done.  Urgent care sent here for evaluation.  EMS did no interventions.  Pain ssscp, 1/10, no radiation, denies associated symptoms, nsmh, but obese, nonsmoker, family history- dad mi age 6. No known prior assessment for ches pain.  Past Medical History  Diagnosis Date  . Allergy   . Arthritis   . Cancer     Skin  . Hypertension    Past Surgical History  Procedure Laterality Date  . Vasectomy    . Fracture surgery     Family History  Problem Relation Age of Onset  . Bone cancer Mother   . Heart attack Father   . Dementia Maternal Grandmother    History  Substance Use Topics  . Smoking status: Never Smoker   . Smokeless tobacco: Not on file  . Alcohol Use: 7.2 oz/week    12 Cans of beer per week    Review of Systems  All other systems reviewed and are negative.     Allergies  Review of patient's allergies indicates no known allergies.  Home Medications   Prior to Admission medications   Medication Sig Start Date End Date Taking? Authorizing Provider  aspirin EC 81 MG tablet Take 81 mg by mouth daily.   Yes Historical Provider, MD  CALCIUM PO Take 1 tablet by mouth daily.   Yes Historical Provider, MD  Cholecalciferol (VITAMIN D) 2000 UNITS CAPS Take 1 capsule by mouth daily.   Yes Historical Provider, MD  fish oil-omega-3 fatty acids 1000 MG capsule Take 1 g by mouth daily.    Yes Historical Provider, MD  GARLIC PO Take 4-5 tablets by  mouth daily.   Yes Historical Provider, MD  Multiple Vitamin (MULTIVITAMIN WITH MINERALS) TABS tablet Take 1 tablet by mouth daily.   Yes Historical Provider, MD  diclofenac (VOLTAREN) 75 MG EC tablet Take 75 mg by mouth daily.    Historical Provider, MD  meloxicam (MOBIC) 15 MG tablet Take 1 tablet (15 mg total) by mouth daily. Patient not taking: Reported on 11/08/2014 08/24/13   Thao P Le, DO   BP 144/80 mmHg  Pulse 83  Temp(Src) 98.7 F (37.1 C) (Oral)  Resp 15  SpO2 97% Physical Exam  Constitutional: He is oriented to person, place, and time. He appears well-developed and well-nourished.  HENT:  Head: Normocephalic and atraumatic.  Right Ear: External ear normal.  Left Ear: External ear normal.  Nose: Nose normal.  Mouth/Throat: Oropharynx is clear and moist.  Eyes: Conjunctivae and EOM are normal. Pupils are equal, round, and reactive to light.  Neck: Normal range of motion. Neck supple.  Cardiovascular: Normal rate, regular rhythm, normal heart sounds and intact distal pulses.   Pulmonary/Chest: Effort normal and breath sounds normal. No respiratory distress. He has no wheezes. He exhibits no tenderness.  Abdominal: Soft. Bowel sounds are normal. He exhibits no distension and no mass. There is no tenderness. There is no guarding.  Musculoskeletal: Normal range of motion.  Neurological:  He is alert and oriented to person, place, and time. He has normal reflexes. He exhibits normal muscle tone. Coordination normal.  Skin: Skin is warm.  Some mild diaphoresis  Psychiatric: He has a normal mood and affect. His behavior is normal. Judgment and thought content normal.  Nursing note and vitals reviewed.   ED Course  Procedures (including critical care time) Labs Review Labs Reviewed  CBC  PROTIME-INR  COMPREHENSIVE METABOLIC PANEL  D-DIMER, QUANTITATIVE (NOT AT [facility])  I-STAT TROPOININ, ED    Imaging Review No results found.   EKG Interpretation   Date/Time:  Wednesday  May 05 2015 09:25:59 EDT Ventricular Rate:  85 PR Interval:  161 QRS Duration: 104 QT Interval:  394 QTC Calculation: 468 R Axis:   59 Text Interpretation:  Normal sinus rhythm Non-specific ST-t changes  Confirmed by Curley Hogen MD, Shin Lamour ([id number]) on 05/05/2015 9:54:23 AM      MDM   Final diagnoses:  Acute coronary syndrome      10:00 AM Patient with increased chest pain and diaphoretic.  MS, heparin nitro ordered.  Repeat ekg hr 63, st elevation inferior leads at almost 1 mm slightly increased from first ekg.  Patient diaphoretic.  Cardiology paged.  Discussed with cardiology and they are coming to see.   10:38 AM Dr. Luther Parody is at bedside. He agrees with concern for change in EKG. First troponin is negative. With discussion with Dr. Luther Parody, plan repeat troponin now. Probable 2 catheterization for acute coronary syndrome.  CRITICAL CARE Performed by: Shaune Pollack Total critical care time: 60 Critical care time was exclusive of separately billable procedures and treating other patients. Critical care was necessary to treat or prevent imminent or life-threatening deterioration. Critical care was time spent personally by me on the following activities: development of treatment plan with patient and/or surrogate as well as nursing, discussions with consultants, evaluation of patient's response to treatment, examination of patient, obtaining history from patient or surrogate, ordering and performing treatments and interventions, ordering and review of laboratory studies, ordering and review of radiographic studies, pulse oximetry and re-evaluation of patient's condition.   Pattricia Boss, MD 05/05/15 [phone]

## 2015-05-05 NOTE — Progress Notes (Signed)
    Cardiac cath reassuring, no CAD  D-Dimer 0.9 however Wells Criteria (0), low risk. No SOB. Suspicion for PE low.   May have been inflammatory process. Now CP free.   Expect DC later today.   Candee Furbish, MD

## 2015-05-07 MED FILL — Nitroglycerin IV Soln 100 MCG/ML in D5W: INTRA_ARTERIAL | Qty: 10 | Status: AC

## 2015-06-04 ENCOUNTER — Other Ambulatory Visit: Payer: Self-pay | Admitting: Physician Assistant

## 2015-07-12 IMAGING — CR DG CHEST 1V PORT
1 series · 1 of 1 positions shown · non-contrast
Comparison: Portable exam 3002 hours compared to 11/08/2014

CLINICAL DATA: Chest pain, history hypertension

EXAM:
PORTABLE CHEST - 1 VIEW

[AP]
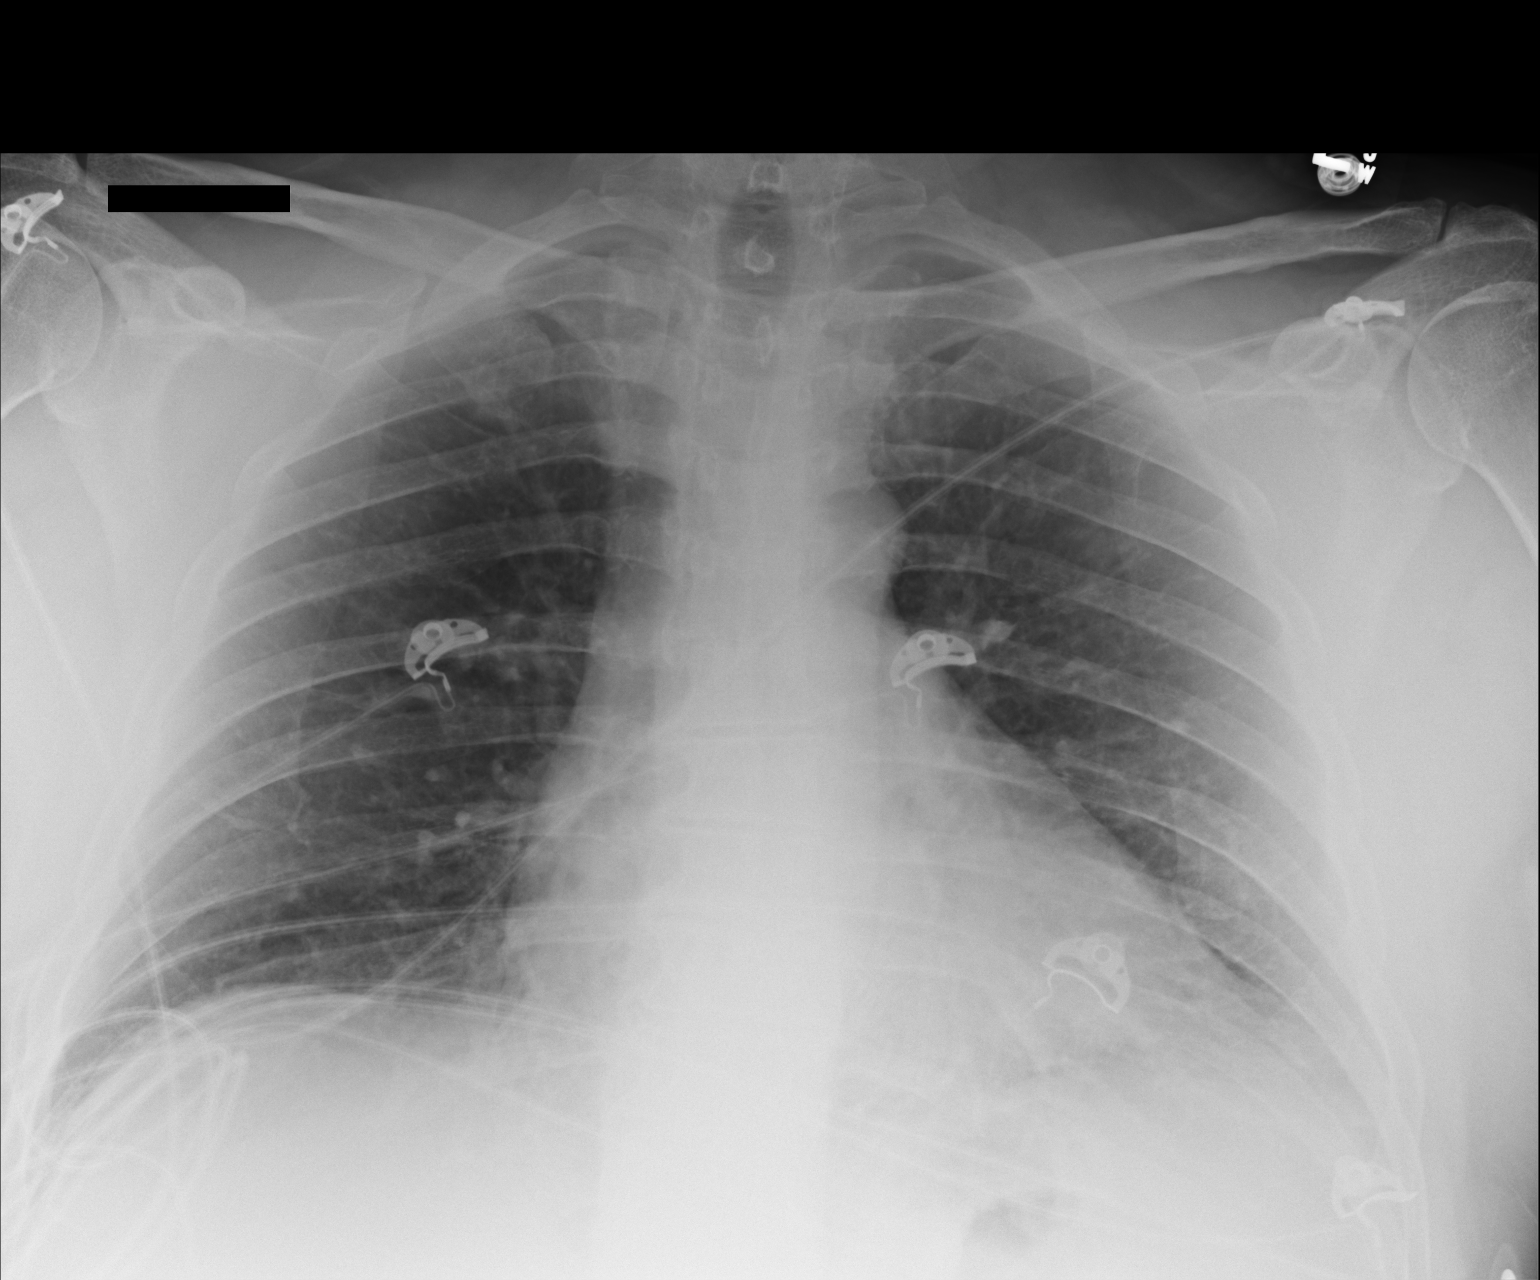

[1 of 1 positions shown; findings below may reference images not displayed]

FINDINGS: Normal heart size, mediastinal contours and pulmonary vascularity.

Lungs clear.

No pleural effusion or pneumothorax.

No acute osseous findings.
IMPRESSION: No acute abnormalities.

## 2015-10-05 ENCOUNTER — Other Ambulatory Visit: Payer: Self-pay | Admitting: Physician Assistant

## 2015-10-06 NOTE — Telephone Encounter (Signed)
Should this be deferred to pcp as patient has never seen Dr Marlou Porch in the office? Per discharge summary patient would only follow up cardiology if needed.

## 2020-03-30 ENCOUNTER — Ambulatory Visit: Payer: 59 | Admitting: Podiatry

## 2020-03-30 ENCOUNTER — Other Ambulatory Visit: Payer: Self-pay | Admitting: Podiatry

## 2020-03-30 ENCOUNTER — Ambulatory Visit (INDEPENDENT_AMBULATORY_CARE_PROVIDER_SITE_OTHER): Payer: 59

## 2020-03-30 ENCOUNTER — Telehealth: Payer: Self-pay | Admitting: *Deleted

## 2020-03-30 ENCOUNTER — Other Ambulatory Visit: Payer: Self-pay

## 2020-03-30 DIAGNOSIS — M19072 Primary osteoarthritis, left ankle and foot: Secondary | ICD-10-CM | POA: Diagnosis not present

## 2020-03-30 DIAGNOSIS — M778 Other enthesopathies, not elsewhere classified: Secondary | ICD-10-CM

## 2020-03-30 DIAGNOSIS — M792 Neuralgia and neuritis, unspecified: Secondary | ICD-10-CM | POA: Diagnosis not present

## 2020-03-30 DIAGNOSIS — M19071 Primary osteoarthritis, right ankle and foot: Secondary | ICD-10-CM | POA: Diagnosis not present

## 2020-03-30 MED ORDER — DICLOFENAC SODIUM 1 % EX GEL: 2.0000 g | Freq: Four times a day (QID) | CUTANEOUS | 2 refills | Status: DC

## 2020-03-30 NOTE — Patient Instructions (Signed)
I have ordered a nerve conduction test. If you do not hear for them about scheduling within the next 1 week, or you have any questions please give [place] a call at [phone].

## 2020-03-30 NOTE — Telephone Encounter (Signed)
Prepared referral order form, demographics to be faxed to [facility] once 03/30/2020 clinicals are available.

## 2020-03-30 NOTE — Telephone Encounter (Signed)
-----   Message from Trula Slade, DPM sent at 03/30/2020  9:31 AM EDT ----- Can you please order  NCV? Thanks.

## 2020-03-31 NOTE — Telephone Encounter (Signed)
done

## 2020-03-31 NOTE — Progress Notes (Signed)
Subjective:   Patient ID: Rick Stokes, male   DOB: 59 y.o.   MRN: [id number]   HPI 59 year old male presents the office today for concerns of burning, tingling sensation in his feet with the right side worse than left which is been on the last 1 year.  He also describes pain to his left foot he states it feels like the foot is "breaking" and he is to take Aleve on a daily basis.  States he does not take Aleve.  Foot hurts overall the right side causing more the issue.  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  He is not working symptoms are much less.  He had denies any recent injury.  Does describe low back as well as hip pain.  He has no other concerns.   Review of Systems  All other systems reviewed and are negative.  Past Medical History:  Diagnosis Date  . Allergy   . Arthritis    "hands get stiff" (05/05/2015)  . Cancer of skin, face    "cused cream; froze some off" (05/05/2015)  . Elevated LFTs 10/2014  . Hypercholesterolemia   . Hypertension   . Skull fracture ~ 2009   "w/loss of consciousness"  . Sleep apnea    "tested positive; working on getting the mask" (05/05/2015)    Past Surgical History:  Procedure Laterality Date  . CARDIAC CATHETERIZATION N/A 05/05/2015   Procedure: Left Heart Cath and Coronary Angiography;  Surgeon: Jerline Pain, MD;  Location: [facility];  Service: Cardiovascular;  Laterality: N/A;  . FRACTURE SURGERY    . PARTIAL HIP ARTHROPLASTY Right 12/2014  . TIBIA FRACTURE SURGERY Right 2003  . VASECTOMY       Current Outpatient Medications:  .  aspirin EC 81 MG tablet, Take 81 mg by mouth daily., Disp: , Rfl:  .  atenolol (TENORMIN) 50 MG tablet, Take 50 mg by mouth daily., Disp: , Rfl:  .  CALCIUM PO, Take 1 tablet by mouth daily., Disp: , Rfl:  .  Cholecalciferol (VITAMIN D) 2000 UNITS CAPS, Take 1 capsule by mouth daily., Disp: , Rfl:  .  fish oil-omega-3 fatty acids 1000 MG  capsule, Take 1 g by mouth daily. , Disp: , Rfl:  .  GARLIC PO, Take 4-5 tablets by mouth daily., Disp: , Rfl:  .  Multiple Vitamin (MULTIVITAMIN WITH MINERALS) TABS tablet, Take 1 tablet by mouth daily., Disp: , Rfl:  .  omeprazole (PRILOSEC) 20 MG capsule, TAKE ONE CAPSULE BY MOUTH ONCE DAILY, Disp: 30 capsule, Rfl: 0 .  diclofenac Sodium (VOLTAREN) 1 % GEL, Apply 2 g topically 4 (four) times daily. Rub into affected area of foot 2 to 4 times daily, Disp: 100 g, Rfl: 2  No Known Allergies       Objective:  Physical Exam  General: AAO x3, NAD  Dermatological: Skin is warm, dry and supple bilateral. Nails x 10 are well manicured; remaining integument appears unremarkable at this time. There are no open sores, no preulcerative lesions, no rash or signs of infection present.  Vascular: Dorsalis Pedis artery and Posterior Tibial artery pedal pulses are 2/4 bilateral with immedate capillary fill time. There is no pain with calf compression, swelling, warmth, erythema.   Neruologic: Grossly intact via light touch bilateral. Vibratory intact via tuning fork bilateral. Protective threshold with Semmes Wienstein monofilament intact to all pedal sites bilateral.  Negative Tinel  sign.  Musculoskeletal: On the dorsal aspect the left foot along Lisfranc joint dorsally prominent exostosis and this is where he gets tenderness palpation.  No other areas of pinpoint tenderness identified bilaterally and flexor, extensor tendons appear to be intact.  Muscular strength 5/5 in all groups tested bilateral.  Gait: Unassisted, Nonantalgic.       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  Arthritic changes present distal to the Lisfranc joint dorsally on the lateral  view.  No evidence of acute fracture identified.  Previous fractures noted bilaterally.  -I long discussion with him in regards to the symptoms he is describing burning and tingling.  Seem to be mostly when sitting.  Also discussed it could be due to his shoe.  Will work on changing his shoe.  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 -Continue Aleve as needed also discussed Voltaren gel.  Trula Slade DPM

## 2020-04-01 NOTE — Telephone Encounter (Signed)
Received clinicals 0601/2021, faxed with required form, and demographics to [facility].

## 2020-07-25 ENCOUNTER — Other Ambulatory Visit: Payer: Self-pay | Admitting: Adult Health

## 2020-07-25 ENCOUNTER — Ambulatory Visit (HOSPITAL_COMMUNITY)
Admission: RE | Admit: 2020-07-25 | Discharge: 2020-07-25 | Disposition: A | Discharge status: Home / Self Care | Payer: 59 | Source: Ambulatory Visit | Attending: Pulmonary Disease | Admitting: Pulmonary Disease

## 2020-07-25 DIAGNOSIS — U071 COVID-19: Secondary | ICD-10-CM

## 2020-07-25 MED ORDER — FAMOTIDINE IN NACL 20-0.9 MG/50ML-% IV SOLN: 20.0000 mg | Freq: Once | INTRAVENOUS | Status: DC | PRN

## 2020-07-25 MED ORDER — DIPHENHYDRAMINE HCL 50 MG/ML IJ SOLN: 50.0000 mg | Freq: Once | INTRAMUSCULAR | Status: DC | PRN

## 2020-07-25 MED ORDER — EPINEPHRINE 0.3 MG/0.3ML IJ SOAJ: 0.3000 mg | Freq: Once | INTRAMUSCULAR | Status: DC | PRN

## 2020-07-25 MED ORDER — SODIUM CHLORIDE 0.9 % IV SOLN
1200.0000 mg | Freq: Once | INTRAVENOUS | Status: AC
  Administered 2020-07-25: 1200 mg via INTRAVENOUS

## 2020-07-25 MED ORDER — ALBUTEROL SULFATE HFA 108 (90 BASE) MCG/ACT IN AERS: 2.0000 | INHALATION_SPRAY | Freq: Once | RESPIRATORY_TRACT | Status: DC | PRN

## 2020-07-25 MED ORDER — METHYLPREDNISOLONE SODIUM SUCC 125 MG IJ SOLR: 125.0000 mg | Freq: Once | INTRAMUSCULAR | Status: DC | PRN

## 2020-07-25 MED ORDER — SODIUM CHLORIDE 0.9 % IV SOLN: INTRAVENOUS | Status: DC | PRN

## 2020-07-25 NOTE — Progress Notes (Signed)
°  Diagnosis: COVID-19  Physician: Dr. Joya Gaskins  Procedure: [facility] Med: casirivimab\imdevimab infusion - Provided patient with casirivimab\imdevimab fact sheet for patients, parents and caregivers prior to infusion.  Complications: No immediate complications noted.  Discharge: Discharged home   Tia Masker 07/25/2020

## 2020-07-25 NOTE — Discharge Instructions (Signed)

## 2020-07-25 NOTE — Progress Notes (Signed)
I connected by phone with Rick Stokes on 07/25/2020 at 9:52 AM to discuss the potential use of a new treatment for mild to moderate COVID-19 viral infection in non-hospitalized patients.  This patient is a 59 y.o. male that meets the FDA criteria for Emergency Use Authorization of COVID monoclonal antibody casirivimab/imdevimab or bamlanivimab/eteseviamb. (high risk exposure from wife that is positive with significant symptoms)   Has mild or moderate COVID-19   Is NOT hospitalized due to COVID-19  Is within 10 days of symptom onset  Has at least one of the high risk factor(s) for progression to severe COVID-19 and/or hospitalization as defined in EUA.  Specific high risk criteria : BMI > 25   I have spoken and communicated the following to the patient or parent/caregiver regarding COVID monoclonal antibody treatment:  1. FDA has authorized the emergency use for the treatment of mild to moderate COVID-19 in adults and pediatric patients with positive results of direct SARS-CoV-2 viral testing who are 63 years of age and older weighing at least 40 kg, and who are at high risk for progressing to severe COVID-19 and/or hospitalization.  2. The significant known and potential risks and benefits of COVID monoclonal antibody, and the extent to which such potential risks and benefits are unknown.  3. Information on available alternative treatments and the risks and benefits of those alternatives, including clinical trials.  4. Patients treated with COVID monoclonal antibody should continue to self-isolate and use infection control measures (e.g., wear mask, isolate, social distance, avoid sharing personal items, clean and disinfect "high touch" surfaces, and frequent handwashing) according to CDC guidelines.   5. The patient or parent/caregiver has the option to accept or refuse COVID monoclonal antibody treatment.  After reviewing this information with the patient, the patient has agreed to  receive one of the available covid 19 monoclonal antibodies and will be provided an appropriate fact sheet prior to infusion.  Set up for 9/26 at 1230  Patient is symptomatic with high risk exposure .  Rexene Edison, NP 07/25/2020 9:52 AM

## 2021-08-18 ENCOUNTER — Telehealth: Payer: Self-pay | Admitting: Oncology

## 2021-08-18 NOTE — Telephone Encounter (Signed)
Scheduled appt per 10/19 referral. Pt's wife is aware of appt date and time.

## 2021-08-22 ENCOUNTER — Telehealth: Payer: Self-pay | Admitting: Oncology

## 2021-08-22 NOTE — Telephone Encounter (Signed)
Rescheduled upcoming appointment per patient's request due to patient wanting a later time. Patient's wife is aware of changes.

## 2021-08-26 ENCOUNTER — Encounter: Payer: 59 | Admitting: Oncology

## 2021-09-02 ENCOUNTER — Other Ambulatory Visit: Payer: Self-pay

## 2021-09-02 ENCOUNTER — Inpatient Hospital Stay: Payer: BC Managed Care – PPO | Attending: Oncology | Admitting: Oncology

## 2021-09-02 VITALS — BP 130/88 | HR 67 | Temp 97.3°F | Resp 20 | Wt 256.0 lb

## 2021-09-02 DIAGNOSIS — D696 Thrombocytopenia, unspecified: Secondary | ICD-10-CM | POA: Insufficient documentation

## 2021-09-02 DIAGNOSIS — I10 Essential (primary) hypertension: Secondary | ICD-10-CM | POA: Diagnosis not present

## 2021-09-02 DIAGNOSIS — Z85828 Personal history of other malignant neoplasm of skin: Secondary | ICD-10-CM | POA: Insufficient documentation

## 2021-09-02 DIAGNOSIS — Z808 Family history of malignant neoplasm of other organs or systems: Secondary | ICD-10-CM | POA: Diagnosis not present

## 2021-09-02 NOTE — Progress Notes (Signed)
Reason for the request:      HPI: I was asked by Dr. Wendie Agreste to evaluate Rick Stokes for the evaluation of thrombocytopenia.  He is a 60 year old man with history of hypertension, hyperlipidemia without any significant comorbid conditions.  He had laboratory testing with his primary care physician in July 2022.  At that time he was noted to have a white cell count 3.4, hemoglobin of 16.2 and a platelet count of 123.  The differential was normal.  CBC repeated on August 10, 2021 showed a normal white cell count of 4.5, hemoglobin of 16.5 and a platelet count of 134.  Previous CBC in 2016 in 2011 showed normal counts.  Clinically, he reports no major complaints at this time.  He does report drinking on a daily basis.  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   He does not report any headaches, blurry vision, syncope or seizures. Does not report any fevers, chills or sweats.  Does not report any cough, wheezing or hemoptysis.  Does not report any chest pain, palpitation, orthopnea or leg edema.  Does not report any nausea, vomiting or abdominal pain.  Does not report any constipation or diarrhea.  Does not report any skeletal complaints.    Does not report frequency, urgency or hematuria.  Does not report any skin rashes or lesions. Does not report any heat or cold intolerance.  Does not report any lymphadenopathy or petechiae.  Does not report any anxiety or depression.  Remaining review of systems is negative.     Past Medical History:  Diagnosis Date   Allergy    Arthritis    "hands get stiff" (05/05/2015)   Cancer of skin, face    "cused cream; froze some off" (05/05/2015)   Elevated LFTs 10/2014   Hypercholesterolemia    Hypertension    Skull fracture ([place]) ~ 2009   "w/loss of consciousness"   Sleep apnea    "tested positive; working on getting the mask" (05/05/2015)  :   Past Surgical History:  Procedure Laterality Date   CARDIAC CATHETERIZATION N/A 05/05/2015    Procedure: Left Heart Cath and Coronary Angiography;  Surgeon: Jerline Pain, MD;  Location: [facility];  Service: Cardiovascular;  Laterality: N/A;   FRACTURE SURGERY     PARTIAL HIP ARTHROPLASTY Right 12/2014   TIBIA FRACTURE SURGERY Right 2003   VASECTOMY    :   Current Outpatient Medications:    aspirin EC 81 MG tablet, Take 81 mg by mouth daily., Disp: , Rfl:    atenolol (TENORMIN) 50 MG tablet, Take 50 mg by mouth daily., Disp: , Rfl:    CALCIUM PO, Take 1 tablet by mouth daily., Disp: , Rfl:    Cholecalciferol (VITAMIN D) 2000 UNITS CAPS, Take 1 capsule by mouth daily., Disp: , Rfl:    diclofenac Sodium (VOLTAREN) 1 % GEL, Apply 2 g topically 4 (four) times daily. Rub into affected area of foot 2 to 4 times daily, Disp: 100 g, Rfl: 2   fish oil-omega-3 fatty acids 1000 MG capsule, Take 1 g by mouth daily. , Disp: , Rfl:    GARLIC PO, Take 4-5 tablets by mouth daily., Disp: , Rfl:    Multiple Vitamin (MULTIVITAMIN WITH MINERALS) TABS tablet, Take 1 tablet by mouth daily., Disp: , Rfl:    omeprazole (PRILOSEC) 20 MG capsule, TAKE ONE CAPSULE BY MOUTH ONCE DAILY, Disp: 30 capsule, Rfl: 0:  No Known Allergies:  Family History  Problem Relation Age of Onset   Bone cancer Mother    Heart attack Father    Dementia Maternal Grandmother   :   Social History   Socioeconomic History   Marital status: Married    Spouse name: Not on file   Number of children: Not on file   Years of education: Not on file   Highest education level: Not on file  Occupational History   Not on file  Tobacco Use   Smoking status: Never   Smokeless tobacco: Current  Substance and Sexual Activity   Alcohol use: Yes    Alcohol/week: 12.0 standard drinks    Types: 12 Cans of beer per week   Drug use: No   Sexual activity: Yes  Other Topics Concern   Not on file  Social History Narrative   Not on file   Social Determinants of Health   Financial Resource Strain: Not on file  Food  Insecurity: Not on file  Transportation Needs: Not on file  Physical Activity: Not on file  Stress: Not on file  Social Connections: Not on file  Intimate Partner Violence: Not on file  :  Pertinent items are noted in HPI.  Exam:  Blood pressure 130/88, pulse 67, temperature (!) 97.3 F (36.3 C), resp. rate 20, weight 256 lb (116.1 kg), SpO2 99 %.  ECOG 0 General appearance: alert and cooperative appeared without distress. Head: atraumatic without any abnormalities. Eyes: conjunctivae/corneas clear. PERRL.  Sclera anicteric. Throat: lips, mucosa, and tongue normal; without oral thrush or ulcers. Resp: clear to auscultation bilaterally without rhonchi, wheezes or dullness to percussion. Cardio: regular rate and rhythm, S1, S2 normal, no murmur, click, rub or gallop GI: soft, non-tender; bowel sounds normal; no masses,  no organomegaly Skin: Skin color, texture, turgor normal. No rashes or lesions Lymph nodes: Cervical, supraclavicular, and axillary nodes normal. Neurologic: Grossly normal without any motor, sensory or deep tendon reflexes. Musculoskeletal: No joint deformity or effusion.    Assessment and Plan:    60 year old with:  1.  Thrombocytopenia noted in July 2022 with a platelet count of 123 and repeated for count of 134 and October 2022.  He has normal CBC otherwise.  The differential diagnosis of these findings were reviewed at this time and management choices were discussed.  His thrombocytopenia appears mild and fluctuating and overall asymptomatic.  The most likely etiology for his thrombocytopenia is alcohol use.  Autoimmune thrombocytopenia versus sequestration versus reactive related to medication were discussed.  Conditions such as MDS, leukemia, TTP, HUS are extremely unlikely  For the time being I recommended continued active surveillance and repeat laboratory testing in 6 months.  I recommended cutting down on his alcohol intake which will improve likely his  platelet count as well as his overall health.  2.  Follow-up: Will be in 6 months.    45  minutes were dedicated to this visit. The time was spent on reviewing laboratory data, discussing treatment options, discussing differential diagnosis and answering questions regarding future plan.      A copy of this consult has been forwarded to the requesting physician.

## 2021-10-17 ENCOUNTER — Encounter: Payer: Self-pay | Admitting: *Deleted

## 2021-10-19 ENCOUNTER — Encounter: Payer: Self-pay | Admitting: Diagnostic Neuroimaging

## 2021-10-19 ENCOUNTER — Ambulatory Visit: Payer: BC Managed Care – PPO | Admitting: Diagnostic Neuroimaging

## 2021-10-19 VITALS — BP 106/66 | HR 70 | Ht 68.0 in | Wt 252.0 lb

## 2021-10-19 DIAGNOSIS — R200 Anesthesia of skin: Secondary | ICD-10-CM

## 2021-10-19 NOTE — Progress Notes (Signed)
[facility]  PATIENT: Rick Stokes DOB: May 17, 1961  REFERRING CLINICIAN: Enid Skeens., MD HISTORY FROM: patient  REASON FOR VISIT: new consult    HISTORICAL  CHIEF COMPLAINT:  Chief Complaint  Patient presents with   New Patient (Initial Visit)    Rm 7 alone here for consult on worsening paresthesia in bilateral hands and right foot. Reports symptoms have been present now for 8 months to a year.      HISTORY OF PRESENT ILLNESS:   60 year old male here for evaluation of bilateral hand numbness.  Symptoms started 8 to 12 months ago with numbness, tingling, pain in bilateral hands digits 1 through 4, right greater than left side.  Also has some trigger finger problems on the left second digit.  Also has some arthritis pain in bilateral hands.  Sometimes feels some numbness and pain in his right foot when he has his foot on the accelerator for a long time.   REVIEW OF SYSTEMS: Full 14 system review of systems performed and negative with exception of: as per HPI.  ALLERGIES: Allergies  Allergen Reactions   Bee Venom Hives    HOME MEDICATIONS: Outpatient Medications Prior to Visit  Medication Sig Dispense Refill   atenolol (TENORMIN) 50 MG tablet Take 50 mg by mouth daily.     Cholecalciferol (VITAMIN D) 2000 UNITS CAPS Take 1 capsule by mouth daily.     diclofenac Sodium (VOLTAREN) 1 % GEL Apply 2 g topically 4 (four) times daily. Rub into affected area of foot 2 to 4 times daily 100 g 2   vitamin k 100 MCG tablet Take 100 mcg by mouth daily.     aspirin EC 81 MG tablet Take 81 mg by mouth daily. (Patient not taking: Reported on 10/19/2021)     CALCIUM PO Take 1 tablet by mouth daily. (Patient not taking: Reported on 10/19/2021)     fish oil-omega-3 fatty acids 1000 MG capsule Take 1 g by mouth daily.  (Patient not taking: Reported on 77/93/9030)     GARLIC PO Take 4-5 tablets by mouth daily. (Patient not taking: Reported on 10/19/2021)      Multiple Vitamin (MULTIVITAMIN WITH MINERALS) TABS tablet Take 1 tablet by mouth daily. (Patient not taking: Reported on 10/19/2021)     omeprazole (PRILOSEC) 20 MG capsule TAKE ONE CAPSULE BY MOUTH ONCE DAILY (Patient not taking: Reported on 10/19/2021) 30 capsule 0   No facility-administered medications prior to visit.    PAST MEDICAL HISTORY: Past Medical History:  Diagnosis Date   Allergy    Arthritis    "hands get stiff" (05/05/2015)   Cancer of skin, face    "cused cream; froze some off" (05/05/2015)   Depression    Elevated LFTs 10/30/2014   Hypercholesterolemia    Hypertension    Paresthesia    Skull fracture ([place]) ~ 2009   "w/loss of consciousness"   Sleep apnea    "tested positive; working on getting the mask" (05/05/2015)    PAST SURGICAL HISTORY: Past Surgical History:  Procedure Laterality Date   CARDIAC CATHETERIZATION N/A 05/05/2015   Procedure: Left Heart Cath and Coronary Angiography;  Surgeon: Jerline Pain, MD;  Location: [facility];  Service: Cardiovascular;  Laterality: N/A;   FRACTURE SURGERY     PARTIAL HIP ARTHROPLASTY Right 12/2014   TIBIA FRACTURE SURGERY Right 2003   VASECTOMY      FAMILY HISTORY: Family History  Problem Relation Age of Onset   Bone cancer  Mother    Heart attack Father    Dementia Maternal Grandmother     SOCIAL HISTORY: Social History   Socioeconomic History   Marital status: Married    Spouse name: Not on file   Number of children: 1   Years of education: Not on file   Highest education level: Not on file  Occupational History   Not on file  Tobacco Use   Smoking status: Never   Smokeless tobacco: Current    Types: Snuff  Substance and Sexual Activity   Alcohol use: Yes    Alcohol/week: 12.0 standard drinks    Types: 12 Cans of beer per week   Drug use: No   Sexual activity: Yes  Other Topics Concern   Not on file  Social History Narrative   Right handed    Caffeine-   Social Determinants of Health    Financial Resource Strain: Not on file  Food Insecurity: Not on file  Transportation Needs: Not on file  Physical Activity: Not on file  Stress: Not on file  Social Connections: Not on file  Intimate Partner Violence: Not on file     PHYSICAL EXAM  GENERAL EXAM/CONSTITUTIONAL: Vitals:  Vitals:   10/19/21 0857  BP: 106/66  Pulse: 70  SpO2: 94%  Weight: 252 lb (114.3 kg)  Height: 5\' 8"  (1.727 m)   Body mass index is 38.32 kg/m. Wt Readings from Last 3 Encounters:  10/19/21 252 lb (114.3 kg)  09/02/21 256 lb (116.1 kg)  05/05/15 250 lb (113.4 kg)   Patient is in no distress; well developed, nourished and groomed; neck is supple  CARDIOVASCULAR: Examination of carotid arteries is normal; no carotid bruits Regular rate and rhythm, no murmurs Examination of peripheral vascular system by observation and palpation is normal  EYES: Ophthalmoscopic exam of optic discs and posterior segments is normal; no papilledema or hemorrhages No results found.  MUSCULOSKELETAL: Gait, strength, tone, movements noted in Neurologic exam below  NEUROLOGIC: MENTAL STATUS:  No flowsheet data found. awake, alert, oriented to person, place and time recent and remote memory intact normal attention and concentration language fluent, comprehension intact, naming intact fund of knowledge appropriate  CRANIAL NERVE:  2nd - no papilledema on fundoscopic exam 2nd, 3rd, 4th, 6th - pupils equal and reactive to light, visual fields full to confrontation, extraocular muscles intact, no nystagmus 5th - facial sensation symmetric 7th - facial strength symmetric 8th - hearing intact 9th - palate elevates symmetrically, uvula midline 11th - shoulder shrug symmetric 12th - tongue protrusion midline  MOTOR:  normal bulk and tone, full strength in the BUE, BLE  SENSORY:  normal and symmetric to light touch, pinprick, temperature, vibration; EXCEPT DECR PP IN FINGERTIPS PHALEN'S BORDERLINE ON  RIGHT TINEL'S NEGATIVE  COORDINATION:  finger-nose-finger, fine finger movements normal  REFLEXES:  deep tendon reflexes TRACE and symmetric  GAIT/STATION:  narrow based gait     DIAGNOSTIC DATA (LABS, IMAGING, TESTING) - I reviewed patient records, labs, notes, testing and imaging myself where available.  Lab Results  Component Value Date   WBC 10.5 05/05/2015   HGB 16.1 05/05/2015   HCT 46.7 05/05/2015   MCV 91.7 05/05/2015   PLT 172 05/05/2015      Component Value Date/Time   NA 139 05/05/2015 0936   K 4.4 05/05/2015 0936   CL 100 (L) 05/05/2015 0936   CO2 28 05/05/2015 0936   GLUCOSE 115 (H) 05/05/2015 0936   BUN 10 05/05/2015 0936   CREATININE 0.99  05/05/2015 0936   CREATININE 0.91 11/08/2014 0907   CALCIUM 9.9 05/05/2015 0936   PROT 7.7 05/05/2015 0936   ALBUMIN 4.2 05/05/2015 0936   AST 55 (H) 05/05/2015 0936   ALT 53 05/05/2015 0936   ALKPHOS 55 05/05/2015 0936   BILITOT 1.1 05/05/2015 0936   GFRNONAA >60 05/05/2015 0936   GFRNONAA >89 11/08/2014 0907   GFRAA >60 05/05/2015 0936   GFRAA >89 11/08/2014 0907   No results found for: CHOL, HDL, LDLCALC, LDLDIRECT, TRIG, CHOLHDL Lab Results  Component Value Date   HGBA1C 5.9 08/24/2013   No results found for: VITAMINB12 No results found for: TSH     ASSESSMENT AND PLAN  60 y.o. year old male here with:   Dx:  1. Bilateral hand numbness      PLAN:  HAND NUMBNESS (possible bilateral carpal tunnel syndrome) - offered to check EMG/NCS (patient will think about it and let us know) - patient will try wrist splints at bedtime for next 4-6 weeks  RIGHT FOOT PAIN (rare numbness; likely compression arthritis / neuropathic pain) - likely arthritis changes; continue conservative mgmt  Return for return to PCP, pending if symptoms worsen or fail to improve.    Penni Bombard, MD 65/68/1275, 17:00 AM Certified in Neurology, Neurophysiology and Neuroimaging  [facility] [address], [place], [place] [address] [phone]

## 2021-10-19 NOTE — Patient Instructions (Addendum)
°  HAND NUMBNESS - consider EMG/NCS nerve testing (patient will think about it and let us know) - consider wrist splint at bedtime  RIGHT FOOT PAIN - likely arthritis changes

## 2022-02-10 ENCOUNTER — Telehealth: Payer: Self-pay | Admitting: Oncology

## 2022-02-10 NOTE — Telephone Encounter (Signed)
Cancelled 05/05 appointments per patient's request, patient does not want to reschedule at the moment. ?

## 2022-03-03 ENCOUNTER — Other Ambulatory Visit: Payer: BC Managed Care – PPO

## 2022-03-03 ENCOUNTER — Ambulatory Visit: Payer: BC Managed Care – PPO | Admitting: Oncology

## 2022-06-09 ENCOUNTER — Encounter: Payer: Self-pay | Admitting: Orthopaedic Surgery

## 2022-06-09 ENCOUNTER — Ambulatory Visit: Payer: BC Managed Care – PPO | Admitting: Orthopaedic Surgery

## 2022-06-09 ENCOUNTER — Ambulatory Visit (INDEPENDENT_AMBULATORY_CARE_PROVIDER_SITE_OTHER): Payer: BC Managed Care – PPO

## 2022-06-09 DIAGNOSIS — M65322 Trigger finger, left index finger: Secondary | ICD-10-CM

## 2022-06-09 DIAGNOSIS — M79622 Pain in left upper arm: Secondary | ICD-10-CM

## 2022-06-09 MED ORDER — LIDOCAINE HCL 1 % IJ SOLN
0.3000 mL | INTRAMUSCULAR | Status: AC | PRN
  Administered 2022-06-09: .3 mL

## 2022-06-09 MED ORDER — METHYLPREDNISOLONE ACETATE 40 MG/ML IJ SUSP
13.3300 mg | INTRAMUSCULAR | Status: AC | PRN
  Administered 2022-06-09: 13.33 mg

## 2022-06-09 MED ORDER — BUPIVACAINE HCL 0.5 % IJ SOLN
0.3300 mL | INTRAMUSCULAR | Status: AC | PRN
  Administered 2022-06-09: .33 mL

## 2022-06-09 NOTE — Progress Notes (Signed)
Office Visit Note   Patient: Rick Stokes           Date of Birth: [birth year]           MRN: [id number] Visit Date: 06/09/2022              Requested by: Enid Skeens., MD [address],  [place] [address] PCP: Enid Skeens., MD   Assessment & Plan: Visit Diagnoses:  1. Trigger index finger of left hand   2. Left upper arm pain     Plan: Impression is left index finger stenosing tenosynovitis.  Treatment options were reviewed and patient elected for cortisone injection.  He will continue to rest the finger and split it as he needs until symptoms resolved.  Left arm pain consistent with biceps strain and overuse.  Recommend RICE.  If symptoms persist, patient may consider MRI.  He will call if he wants to do that.    Follow-Up Instructions: No follow-ups on file.   Orders:  Orders Placed This Encounter  Procedures   Hand/UE Inj   XR Humerus Left   No orders of the defined types were placed in this encounter.     Procedures: Hand/UE Inj: L index A1 for trigger finger on 06/09/2022 8:30 AM Indications: pain Details: 25 G needle Medications: 0.3 mL lidocaine 1 %; 0.33 mL bupivacaine 0.5 %; 13.33 mg methylPREDNISolone acetate 40 MG/ML Outcome: tolerated well, no immediate complications Consent was given by the patient. Patient was prepped and draped in the usual sterile fashion.       Clinical Data: No additional findings.   Subjective: Chief Complaint  Patient presents with   Left Index Finger - Pain   Left Arm - Pain    HPI Rick Stokes is a 61 year old gentleman here for evaluation of 2 separate problems.  First problem is a locking left index finger that is worse with activity.  Splits wood during the wintertime which really aggravates it.  Has been wearing a splint on his finger which helps but with splitting season is coming up.  Denies any injuries.  The second problem is left upper arm pain that is worse when reaching back such as when he grabs  his seatbelt.  Denies any radiculopathy or injuries.  Denies any real shoulder pain.  Review of Systems  Constitutional: Negative.   All other systems reviewed and are negative.    Objective: Vital Signs: There were no vitals taken for this visit.  Physical Exam Vitals and nursing note reviewed.  Constitutional:      Appearance: He is well-developed.  HENT:     Head: Normocephalic and atraumatic.  Eyes:     Pupils: Pupils are equal, round, and reactive to light.  Pulmonary:     Effort: Pulmonary effort is normal.  Abdominal:     Palpations: Abdomen is soft.  Musculoskeletal:        General: Normal range of motion.     Cervical back: Neck supple.  Skin:    General: Skin is warm.  Neurological:     Mental Status: He is alert and oriented to person, place, and time.  Psychiatric:        Behavior: Behavior normal.        Thought Content: Thought content normal.        Judgment: Judgment normal.     Ortho Exam Examination of the left index finger shows tenderness in the palm over the A1 pulley of the index finger.  There is slight nodule that I can palpate with flexion of the finger.  He has trouble making a full composite fist with the index finger.  Examination of the left upper arm shows no masses lesions or ulcers.  Shoulder range of motion is well-tolerated.  No focal findings to suggest cuff or labral or biceps tendon problems.  Distal biceps is nontender.  He has adequate strength of the elbow and shoulder.  Cervical spine is unremarkable.  Specialty Comments:  No specialty comments available.  Imaging: XR Humerus Left  Result Date: 06/09/2022 No acute or structural abnormalities.  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      PMFS History: Patient Active Problem List   Diagnosis Date Noted   Left upper arm pain 06/09/2022   Trigger index finger of left hand 06/09/2022   Unstable angina (HCC) 05/05/2015   Chest pain 05/05/2015   OSA (obstructive sleep apnea)  11/08/2014   Nonspecific abnormal electrocardiogram (ECG) (EKG) 11/08/2014   Past Medical History:  Diagnosis Date   Allergy    Arthritis    "hands get stiff" (05/05/2015)   Cancer of skin, face    "cused cream; froze some off" (05/05/2015)   Depression    Elevated LFTs 10/30/2014   Hypercholesterolemia    Hypertension    Paresthesia    Skull fracture ([place]) ~ 2009   "w/loss of consciousness"   Sleep apnea    "tested positive; working on getting the mask" (05/05/2015)    Family History  Problem Relation Age of Onset   Bone cancer Mother    Heart attack Father    Dementia Maternal Grandmother     Past Surgical History:  Procedure Laterality Date   CARDIAC CATHETERIZATION N/A 05/05/2015   Procedure: Left Heart Cath and Coronary Angiography;  Surgeon: Jerline Pain, MD;  Location: [facility];  Service: Cardiovascular;  Laterality: N/A;   FRACTURE SURGERY     PARTIAL HIP ARTHROPLASTY Right 12/2014   TIBIA FRACTURE SURGERY Right 2003   VASECTOMY     Social History   Occupational History   Not on file  Tobacco Use   Smoking status: Never   Smokeless tobacco: Current    Types: Snuff  Substance and Sexual Activity   Alcohol use: Yes    Alcohol/week: 12.0 standard drinks of alcohol    Types: 12 Cans of beer per week   Drug use: No   Sexual activity: Yes

## 2022-08-17 ENCOUNTER — Emergency Department (HOSPITAL_COMMUNITY): Payer: BC Managed Care – PPO

## 2022-08-17 ENCOUNTER — Encounter (HOSPITAL_COMMUNITY): Payer: Self-pay

## 2022-08-17 ENCOUNTER — Emergency Department (HOSPITAL_COMMUNITY)
Admission: EM | Admit: 2022-08-17 | Discharge: 2022-08-17 | Disposition: A | Discharge status: Home / Self Care | Payer: BC Managed Care – PPO | Attending: Emergency Medicine | Admitting: Emergency Medicine

## 2022-08-17 ENCOUNTER — Other Ambulatory Visit: Payer: Self-pay

## 2022-08-17 DIAGNOSIS — S0285XA Fracture of orbit, unspecified, initial encounter for closed fracture: Secondary | ICD-10-CM | POA: Insufficient documentation

## 2022-08-17 DIAGNOSIS — S0993XA Unspecified injury of face, initial encounter: Secondary | ICD-10-CM | POA: Diagnosis present

## 2022-08-17 DIAGNOSIS — W01198A Fall on same level from slipping, tripping and stumbling with subsequent striking against other object, initial encounter: Secondary | ICD-10-CM | POA: Insufficient documentation

## 2022-08-17 MED ORDER — AMOXICILLIN-POT CLAVULANATE 875-125 MG PO TABS
1.0000 | ORAL_TABLET | Freq: Once | ORAL | Status: AC
  Administered 2022-08-17: 1 via ORAL
  Filled 2022-08-17: qty 1

## 2022-08-17 MED ORDER — AMOXICILLIN-POT CLAVULANATE 875-125 MG PO TABS: 1.0000 | ORAL_TABLET | Freq: Two times a day (BID) | ORAL | 0 refills | Status: DC

## 2022-08-17 NOTE — ED Triage Notes (Signed)
Mechanial fall on concrete pt states hit back of head and right elbow. Swelling and bruising to right upper eye lid. Denies LOC or blood thinners. Pt friend at bedside states pt seems disoriented not at baseline. Pt alert to self and place.

## 2022-08-17 NOTE — ED Provider Notes (Signed)
[facility] DEPT Provider Note   CSN: [id number] Arrival date & time: 08/17/22  1843     History  Chief Complaint  Patient presents with   Fall   HPI Rick Stokes. is a 61 y.o. male presenting status post mechanical fall that occurred 2 hours ago. States he was moving some heavy machinery and tripped landing on the right side of his body and hitting his right eye.  Denies loss of consciousness.  Wife concerned that he is confused.  He knows where he is and his birthday but cannot recall who the president is at this time according to his wife is a topic he discusses frequently.  Denies blood thinners.  Denies chest pain or shortness of breath.  H/o skull fracture in 2009 per wife required admission to the ICU.  At this time patient is not endorsing headache or any other pain associated with his fall.  Denies visual disturbance.  Denies blood thinners.  Denies alcohol consumption or substance use.   Fall       Home Medications Prior to Admission medications   Medication Sig Start Date End Date Taking? Authorizing Provider  amoxicillin-clavulanate (AUGMENTIN) 875-125 MG tablet Take 1 tablet by mouth every 12 (twelve) hours. 08/17/22  Yes Harriet Pho, PA-C  atenolol (TENORMIN) 50 MG tablet Take 50 mg by mouth daily. 02/29/20   [provider]  Cholecalciferol (VITAMIN D) 2000 UNITS CAPS Take 1 capsule by mouth daily.    [provider]  diclofenac Sodium (VOLTAREN) 1 % GEL Apply 2 g topically 4 (four) times daily. Rub into affected area of foot 2 to 4 times daily 03/30/20   Trula Slade, DPM  vitamin k 100 MCG tablet Take 100 mcg by mouth daily.    [provider]      Allergies    Bee venom    Review of Systems   Review of Systems  Psychiatric/Behavioral:  Positive for confusion.     Physical Exam Updated Vital Signs BP (!) 136/92   Pulse (!) 58   Temp 98.3 F (36.8 C) (Oral)   Resp 17   Ht '5\' 8"'$   (1.727 m)   Wt 104.3 kg   SpO2 94%   BMI 34.97 kg/m  Physical Exam Vitals and nursing note reviewed.  HENT:     Head: Normocephalic and atraumatic.     Mouth/Throat:     Mouth: Mucous membranes are moist.  Eyes:     General:        Right eye: No discharge.        Left eye: No discharge.     Extraocular Movements: Extraocular movements intact.     Conjunctiva/sclera: Conjunctivae normal.     Comments: Area of ecchymosis and swelling over the right eye and periorbital space  Cardiovascular:     Rate and Rhythm: Normal rate and regular rhythm.     Pulses: Normal pulses.     Heart sounds: Normal heart sounds.  Pulmonary:     Effort: Pulmonary effort is normal.     Breath sounds: Normal breath sounds.  Abdominal:     General: Abdomen is flat.     Palpations: Abdomen is soft.  Skin:    General: Skin is warm and dry.  Neurological:     General: No focal deficit present.     Comments: GCS 15. Speech is goal oriented. No deficits appreciated to CN III-XII; symmetric eyebrow raise, no facial drooping, tongue midline. Patient  has equal grip strength bilaterally with 5/5 strength against resistance in all major muscle groups bilaterally. Sensation to light touch intact. Patient moves extremities without ataxia. Normal finger-nose-finger. Patient ambulatory with steady gait.  Asked patient again to the present was.  Patient cannot recall.  Otherwise awake alert and oriented x3.   Psychiatric:        Mood and Affect: Mood normal.     ED Results / Procedures / Treatments   Labs (all labs ordered are listed, but only abnormal results are displayed) Labs Reviewed - No data to display  EKG None  Radiology CT Head Wo Contrast  Result Date: 08/17/2022 CLINICAL DATA:  Mechanical fall on concrete with trauma to back of head and face EXAM: CT HEAD WITHOUT CONTRAST CT MAXILLOFACIAL WITHOUT CONTRAST CT CERVICAL SPINE WITHOUT CONTRAST TECHNIQUE: Multidetector CT imaging of the head,  cervical spine, and maxillofacial structures were performed using the standard protocol without intravenous contrast. Multiplanar CT image reconstructions of the cervical spine and maxillofacial structures were also generated. RADIATION DOSE REDUCTION: This exam was performed according to the departmental dose-optimization program which includes automated exposure control, adjustment of the mA and/or kV according to patient size and/or use of iterative reconstruction technique. COMPARISON:  None Available. FINDINGS: CT HEAD FINDINGS Brain: No evidence of acute infarction, hemorrhage, hydrocephalus, extra-axial collection or mass lesion/mass effect. Vascular: No hyperdense vessel or unexpected calcification. Skull: Normal. Negative for fracture or focal lesion. Other: None. CT MAXILLOFACIAL FINDINGS Osseous: Acute mildly displaced fracture of the right inferior orbital wall. There is small amount of soft tissue extending through the defect. Orbits: Soft tissue contusion/hematoma about the right orbit. The globes are intact. Sinuses: Air-fluid level in the right maxillary sinus favored to represent blood products. Soft tissues: Contusion of the right cheek. CT CERVICAL SPINE FINDINGS Alignment: No traumatic malalignment. Skull base and vertebrae: No acute fracture. No primary bone lesion or focal pathologic process. Soft tissues and spinal canal: No prevertebral fluid or swelling. No visible canal hematoma. Disc levels: Mild multilevel spondylosis and disc space height loss. Posterior disc osteophyte complexes at C5-C6 and C6-C7 cause mild effacement of the ventral thecal sac. No high-grade spinal canal narrowing. Upper chest: Negative. Other: None. IMPRESSION: 1. No acute intracranial abnormality. 2. Mildly displaced fracture of the right inferior orbital wall. Small amount of soft tissue extends through the defect. Clinical correlation recommended to exclude entrapment. 3. Right periorbital hematoma.  Globes are  intact. 4. Air-fluid level in the right maxillary sinus compatible with blood products. 5. No acute cervical spine fracture. Electronically Signed   By: Placido Sou M.D.   On: 08/17/2022 21:39   CT Cervical Spine Wo Contrast  Result Date: 08/17/2022 CLINICAL DATA:  Mechanical fall on concrete with trauma to back of head and face EXAM: CT HEAD WITHOUT CONTRAST CT MAXILLOFACIAL WITHOUT CONTRAST CT CERVICAL SPINE WITHOUT CONTRAST TECHNIQUE: Multidetector CT imaging of the head, cervical spine, and maxillofacial structures were performed using the standard protocol without intravenous contrast. Multiplanar CT image reconstructions of the cervical spine and maxillofacial structures were also generated. RADIATION DOSE REDUCTION: This exam was performed according to the departmental dose-optimization program which includes automated exposure control, adjustment of the mA and/or kV according to patient size and/or use of iterative reconstruction technique. COMPARISON:  None Available. FINDINGS: CT HEAD FINDINGS Brain: No evidence of acute infarction, hemorrhage, hydrocephalus, extra-axial collection or mass lesion/mass effect. Vascular: No hyperdense vessel or unexpected calcification. Skull: Normal. Negative for fracture or focal lesion. Other: None. CT  MAXILLOFACIAL FINDINGS Osseous: Acute mildly displaced fracture of the right inferior orbital wall. There is small amount of soft tissue extending through the defect. Orbits: Soft tissue contusion/hematoma about the right orbit. The globes are intact. Sinuses: Air-fluid level in the right maxillary sinus favored to represent blood products. Soft tissues: Contusion of the right cheek. CT CERVICAL SPINE FINDINGS Alignment: No traumatic malalignment. Skull base and vertebrae: No acute fracture. No primary bone lesion or focal pathologic process. Soft tissues and spinal canal: No prevertebral fluid or swelling. No visible canal hematoma. Disc levels: Mild multilevel  spondylosis and disc space height loss. Posterior disc osteophyte complexes at C5-C6 and C6-C7 cause mild effacement of the ventral thecal sac. No high-grade spinal canal narrowing. Upper chest: Negative. Other: None. IMPRESSION: 1. No acute intracranial abnormality. 2. Mildly displaced fracture of the right inferior orbital wall. Small amount of soft tissue extends through the defect. Clinical correlation recommended to exclude entrapment. 3. Right periorbital hematoma.  Globes are intact. 4. Air-fluid level in the right maxillary sinus compatible with blood products. 5. No acute cervical spine fracture. Electronically Signed   By: Placido Sou M.D.   On: 08/17/2022 21:39   CT Maxillofacial WO CM  Result Date: 08/17/2022 CLINICAL DATA:  Mechanical fall on concrete with trauma to back of head and face EXAM: CT HEAD WITHOUT CONTRAST CT MAXILLOFACIAL WITHOUT CONTRAST CT CERVICAL SPINE WITHOUT CONTRAST TECHNIQUE: Multidetector CT imaging of the head, cervical spine, and maxillofacial structures were performed using the standard protocol without intravenous contrast. Multiplanar CT image reconstructions of the cervical spine and maxillofacial structures were also generated. RADIATION DOSE REDUCTION: This exam was performed according to the departmental dose-optimization program which includes automated exposure control, adjustment of the mA and/or kV according to patient size and/or use of iterative reconstruction technique. COMPARISON:  None Available. FINDINGS: CT HEAD FINDINGS Brain: No evidence of acute infarction, hemorrhage, hydrocephalus, extra-axial collection or mass lesion/mass effect. Vascular: No hyperdense vessel or unexpected calcification. Skull: Normal. Negative for fracture or focal lesion. Other: None. CT MAXILLOFACIAL FINDINGS Osseous: Acute mildly displaced fracture of the right inferior orbital wall. There is small amount of soft tissue extending through the defect. Orbits: Soft tissue  contusion/hematoma about the right orbit. The globes are intact. Sinuses: Air-fluid level in the right maxillary sinus favored to represent blood products. Soft tissues: Contusion of the right cheek. CT CERVICAL SPINE FINDINGS Alignment: No traumatic malalignment. Skull base and vertebrae: No acute fracture. No primary bone lesion or focal pathologic process. Soft tissues and spinal canal: No prevertebral fluid or swelling. No visible canal hematoma. Disc levels: Mild multilevel spondylosis and disc space height loss. Posterior disc osteophyte complexes at C5-C6 and C6-C7 cause mild effacement of the ventral thecal sac. No high-grade spinal canal narrowing. Upper chest: Negative. Other: None. IMPRESSION: 1. No acute intracranial abnormality. 2. Mildly displaced fracture of the right inferior orbital wall. Small amount of soft tissue extends through the defect. Clinical correlation recommended to exclude entrapment. 3. Right periorbital hematoma.  Globes are intact. 4. Air-fluid level in the right maxillary sinus compatible with blood products. 5. No acute cervical spine fracture. Electronically Signed   By: Placido Sou M.D.   On: 08/17/2022 21:39    Procedures Procedures    Medications Ordered in ED Medications  amoxicillin-clavulanate (AUGMENTIN) 875-125 MG per tablet 1 tablet (has no administration in time range)    ED Course/ Medical Decision Making/ A&P  Medical Decision Making  This patient presents to the ED for concern of fall, this involves a number of treatment options, and is a complaint that carries with it a high risk of complications and morbidity.  The differential diagnosis includes skull fracture, ICH, concussion and musculoskeletal trauma.   Co morbidities: Discussed in HPI  EMR reviewed including pt PMHx, past surgical history and past visits to ER.   See HPI for more details   Lab Tests:   No labs ordered   Imaging Studies:  Abnormal  findings. I personally reviewed all imaging studies. Imaging notable for right inferior orbital fracture    Cardiac Monitoring:  The patient was maintained on a cardiac monitor.  I personally viewed and interpreted the cardiac monitored which showed an underlying rhythm of: NSR NA   Medicines ordered:  I ordered medication including Augmentin for empiric treatment of sinusitis in setting of new right inferior orbital fracture Reevaluation of the patient after these medicines showed that the patient stayed the same I have reviewed the patients home medicines and have made adjustments as needed    Consults/Attending Physician   I discussed this case with my attending physician who cosigned this note including patient's presenting symptoms, physical exam, and planned diagnostics and interventions. Attending physician stated agreement with plan or made changes to plan which were implemented.   Reevaluation:  After the interventions noted above I re-evaluated patient and found that they have :stayed the same     Problem List / ED Course:  Presented for mechanical fall.  Given the mechanism of his injury initial concern was possible skull fracture versus head bleed unlikely given the results of CT scan.  CT scan of his maxillofacial revealed right inferior orbital fracture.  Treated with Augmentin for 10 for empiric coverage of sinusitis.  Advised patient to follow-up with ENT in the next 2 to 3 days.  Discussed return precautions.  Offered medication for pain but patient denied stating his pain was only mild.   Dispostion:  After consideration of the diagnostic results and the patients response to treatment, I feel that the patent would benefit from discharge on antibiotics for empiric treatment of sinusitis and follow-up with ENT for right inferior orbital fracture.         Final Clinical Impression(s) / ED Diagnoses Final diagnoses:  Closed fracture of orbit, initial  encounter [facility])    Rx / DC Orders ED Discharge Orders          Ordered    amoxicillin-clavulanate (AUGMENTIN) 875-125 MG tablet  Every 12 hours        08/17/22 2151              Harriet Pho, PA-C 08/17/22 2156    Drenda Freeze, MD 08/22/22 1450

## 2022-08-17 NOTE — Discharge Instructions (Addendum)
Evaluation of your mechanical fall today revealed that you do have a right inferior orbital fracture which is a fracture of the bone just below your right eye.  We will start you on a course of Augmentin to prevent infection as you are at risk for sinusitis.  Also strongly advised that you follow-up with ENT in the next couple days for reevaluation of your orbital fracture.  If you have new visual disturbance, slurred speech, facial droop, weakness or tingling in your extremities please return to the ED for further evaluation.

## 2023-05-22 ENCOUNTER — Encounter: Payer: Self-pay | Admitting: Cardiology

## 2023-06-13 NOTE — Progress Notes (Signed)
Cardiology Office Note:    Date:  06/14/2023   ID:  Rick Stokes., DOB [birth year], MRN [id number]  PCP:  Nonnie Done., MD  Cardiologist:  Norman Herrlich, MD   Referring MD: Nonnie Done., MD  ASSESSMENT:    1. Bilateral edema of lower extremity   2. Essential hypertension   3. Dyslipidemia   4. OSA (obstructive sleep apnea)   5. Obstructive sleep apnea syndrome    PLAN:    In order of problems listed above:  Differential diagnosis includes heart failure predominantly right-sided pulmonary artery hypertension pericardial disease versus severe obstructive sleep apnea untreated with pseudo heart failure versus liver disease with heavy alcohol usage versus nephrotic syndrome He needs to get an echocardiogram done as quickly as possible to sort out whether this is obstructive sleep apnea versus heart failure Regard less optimize his diuretic transition to torsemide and add MRA 2 weeks check BMP in the office Schedule for home sleep study he said he would consider treatment modalities I think he is a good candidate for semaglutide I will send a note to his primary care physician  4-week follow-up   Medication Adjustments/Labs and Tests Ordered: Current medicines are reviewed at length with the patient today.  Concerns regarding medicines are outlined above.  Orders Placed This Encounter  Procedures   Basic Metabolic Panel (BMET)   EKG 12-Lead   ECHOCARDIOGRAM COMPLETE   [facility] Sleep Study   Meds ordered this encounter  Medications   torsemide (DEMADEX) 20 MG tablet    Sig: Take 1 tablet (20 mg total) by mouth daily.    Dispense:  90 tablet    Refill:  3   spironolactone (ALDACTONE) 25 MG tablet    Sig: Take 1 tablet (25 mg total) by mouth daily.    Dispense:  90 tablet    Refill:  3    History of Present Illness:    Rick Stokes. is a 62 y.o. male who is being seen today for the evaluation of  suspected congestive heart failure at the request  of [place], Excell Seltzer., MD.  He was seen with his primary care physician 05/21/2023 with complaints of weight gain 30 pounds edema lower extremities exertional shortness of breath and he had been self-medicating with his mother's furosemide.  He has a background history of hypertension hyperlipidemia sleep apnea morbid obesity BMI approaching 45 and alcohol abuse.  An EKG performed that day showing sinus rhythm right bundle branch block and PVCs.  He was admitted to [facility] seen by heart care in July 2016 presented with chest pain underwent left heart catheterization showing no significant CAD and  calcification of the proximal left anterior descending coronary artery left ventricular function ejection fraction 60% no mitral regurgitation and normal left ventricular end-diastolic pressure.  Recent labs 05/21/2023 hemoglobin 15.4 platelets mildly diminished 246,000 CMP potassium 3.3 sodium 140 creatinine 0.82 GFR 100 cc/min TSH mildly elevated at 5.1H BNP level was low 67 in the heart failure exclusion zone.  Recently he developed edema and weight gain he is not experiencing shortness of breath. He has no known history of congestive heart failure liver disease or kidney disease He is taking a low-dose loop diuretic and has had no clinical response He is frustrated by his BMI and inability to lose weight and inquires about semaglutide therapy He has known sleep apnea from a test done more than a decade ago and is never been treated His wife who is  a med tech he is to work on the cardiology floor at [facility] observes where he stops breathing and has saturations of less than 70% He is tired during the day falls asleep frequently STOP-BANG score is 8 He is not having orthopnea palpitations syncope  Past Medical History:  Diagnosis Date   Allergy    Arthritis    "hands get stiff" (05/05/2015)   Cancer of skin, face    "cused cream; froze some off" (05/05/2015)   Depression     Elevated LFTs 10/30/2014   Hypercholesterolemia    Hypertension    Paresthesia    Skull fracture (HCC) ~ 2009   "w/loss of consciousness"   Sleep apnea    "tested positive; working on getting the mask" (05/05/2015)    Past Surgical History:  Procedure Laterality Date   CARDIAC CATHETERIZATION N/A 05/05/2015   Procedure: Left Heart Cath and Coronary Angiography;  Surgeon: Jake Bathe, MD;  Location: [facility] INVASIVE CV LAB;  Service: Cardiovascular;  Laterality: N/A;   FRACTURE SURGERY     PARTIAL HIP ARTHROPLASTY Right 12/2014   TIBIA FRACTURE SURGERY Right 2003   VASECTOMY      Current Medications: Current Meds  Medication Sig   Acetaminophen (PAIN RELIEF 8 HOUR PO) Take 2 tablets by mouth daily.   amLODipine (NORVASC) 5 MG tablet Take 5 mg by mouth daily.   atenolol (TENORMIN) 50 MG tablet Take 50 mg by mouth daily.   Cholecalciferol (VITAMIN D) 2000 UNITS CAPS Take 1 capsule by mouth daily.   levothyroxine (SYNTHROID) 25 MCG tablet Take 25 mcg by mouth daily before breakfast.   potassium chloride (KLOR-CON) 10 MEQ tablet Take 10 mEq by mouth 2 (two) times daily.   sertraline (ZOLOFT) 50 MG tablet Take 50 mg by mouth daily.   spironolactone (ALDACTONE) 25 MG tablet Take 1 tablet (25 mg total) by mouth daily.   torsemide (DEMADEX) 20 MG tablet Take 1 tablet (20 mg total) by mouth daily.   [DISCONTINUED] furosemide (LASIX) 20 MG tablet Take 20 mg by mouth daily.     Allergies:   Bee venom   Social History   Socioeconomic History   Marital status: Married    Spouse name: Not on file   Number of children: 1   Years of education: Not on file   Highest education level: Not on file  Occupational History   Not on file  Tobacco Use   Smoking status: Never   Smokeless tobacco: Current    Types: Snuff  Substance and Sexual Activity   Alcohol use: Yes    Alcohol/week: 12.0 standard drinks of alcohol    Types: 12 Cans of beer per week   Drug use: No   Sexual activity: Yes  Other  Topics Concern   Not on file  Social History Narrative   Right handed    Caffeine-   Social Determinants of Health   Financial Resource Strain: Not on file  Food Insecurity: Not on file  Transportation Needs: Not on file  Physical Activity: Not on file  Stress: Not on file  Social Connections: Not on file     Family History: The patient's family history includes Bone cancer in his mother; Coronary artery disease in his father; Dementia in his maternal grandmother; Heart attack in his father; Lung cancer in his mother.  ROS:   ROS Please see the history of present illness.     All other systems reviewed and are negative.  EKGs/Labs/Other Studies  Reviewed:    The following studies were reviewed today:   EKG Interpretation Date/Time:  Thursday June 14 2023 16:16:50 EDT Ventricular Rate:  65 PR Interval:  182 QRS Duration:  132 QT Interval:  544 QTC Calculation: 565 R Axis:   -16  Text Interpretation: Normal sinus rhythm Right bundle branch block T wave abnormality, consider lateral ischemia v repolarization When compared with ECG of 05-May-2015 09:52, PREVIOUS ECG IS PRESENT Confirmed by Norman Herrlich ([id number]) on 06/14/2023 4:21:50 PM      Physical Exam:    VS:  BP 120/82 (BP Location: Right Arm, Patient Position: Sitting, Cuff Size: Normal)   Pulse 65   Ht 5\' 8"  (1.727 m)   Wt 279 lb 6.4 oz (126.7 kg)   SpO2 98%   BMI 42.48 kg/m     Wt Readings from Last 3 Encounters:  06/14/23 279 lb 6.4 oz (126.7 kg)  08/17/22 230 lb (104.3 kg)  10/19/21 252 lb (114.3 kg)     GEN: Obese BMI exceeds 40 well nourished, well developed in no acute distress HEENT: Normal NECK: No JVD; No carotid bruits LYMPHATICS: No lymphadenopathy CARDIAC: RRR, no murmurs, rubs, gallops RESPIRATORY:  Clear to auscultation without rales, wheezing or rhonchi  ABDOMEN: Soft, non-tender, non-distended MUSCULOSKELETAL: 2-3+ bilateral lower extremity pitting bilateral edema; No deformity  SKIN:  Warm and dry NEUROLOGIC:  Alert and oriented x 3 PSYCHIATRIC:  Normal affect     Signed, Norman Herrlich, MD  06/14/2023 5:01 PM    [facility]

## 2023-06-14 ENCOUNTER — Ambulatory Visit: Payer: BC Managed Care – PPO | Attending: Cardiology | Admitting: Cardiology

## 2023-06-14 ENCOUNTER — Encounter: Payer: Self-pay | Admitting: Cardiology

## 2023-06-14 VITALS — BP 120/82 | HR 65 | Ht 68.0 in | Wt 279.4 lb

## 2023-06-14 DIAGNOSIS — I10 Essential (primary) hypertension: Secondary | ICD-10-CM

## 2023-06-14 DIAGNOSIS — G4733 Obstructive sleep apnea (adult) (pediatric): Secondary | ICD-10-CM

## 2023-06-14 DIAGNOSIS — R0602 Shortness of breath: Secondary | ICD-10-CM

## 2023-06-14 DIAGNOSIS — R600 Localized edema: Secondary | ICD-10-CM | POA: Diagnosis not present

## 2023-06-14 DIAGNOSIS — I4510 Unspecified right bundle-branch block: Secondary | ICD-10-CM

## 2023-06-14 DIAGNOSIS — E785 Hyperlipidemia, unspecified: Secondary | ICD-10-CM | POA: Diagnosis not present

## 2023-06-14 MED ORDER — TORSEMIDE 20 MG PO TABS: 20.0000 mg | ORAL_TABLET | Freq: Every day | ORAL | 3 refills | Status: DC

## 2023-06-14 MED ORDER — SPIRONOLACTONE 25 MG PO TABS: 25.0000 mg | ORAL_TABLET | Freq: Every day | ORAL | 3 refills | Status: AC

## 2023-06-14 NOTE — Patient Instructions (Signed)
Medication Instructions:  Your physician has recommended you make the following change in your medication:   START: Torsemide 20 mg daily START: Spirinolactone 25 mg daily STOP: Furosemide   *If you need a refill on your cardiac medications before your next appointment, please call your pharmacy*   Lab Work: Your physician recommends that you return for lab work in:   Labs in 2 weeks: BMP  If you have labs (blood work) drawn today and your tests are completely normal, you will receive your results only by: MyChart Message (if you have MyChart) OR A paper copy in the mail If you have any lab test that is abnormal or we need to change your treatment, we will call you to review the results.   Testing/Procedures: Itamar Sleep study  Your physician has requested that you have an echocardiogram. Echocardiography is a painless test that uses sound waves to create images of your heart. It provides your doctor with information about the size and shape of your heart and how well your heart's chambers and valves are working. This procedure takes approximately one hour. There are no restrictions for this procedure. Please do NOT wear cologne, perfume, aftershave, or lotions (deodorant is allowed). Please arrive 15 minutes prior to your appointment time.    Follow-Up: At [facility], you and your health needs are our priority.  As part of our continuing mission to provide you with exceptional heart care, we have created designated Provider Care Teams.  These Care Teams include your primary Cardiologist (physician) and Advanced Practice Providers (APPs -  Physician Assistants and Nurse Practitioners) who all work together to provide you with the care you need, when you need it.  We recommend signing up for the patient portal called "MyChart".  Sign up information is provided on this After Visit Summary.  MyChart is used to connect with patients for Virtual Visits (Telemedicine).   Patients are able to view lab/test results, encounter notes, upcoming appointments, etc.  Non-urgent messages can be sent to your provider as well.   To learn more about what you can do with MyChart, go to [email or web address].    Your next appointment:   4 week(s)  Provider:   Norman Herrlich, MD    Other Instructions None

## 2023-06-25 ENCOUNTER — Telehealth: Payer: Self-pay | Admitting: Cardiology

## 2023-06-25 NOTE — Telephone Encounter (Signed)
Pt's wife states the pt is waiting to hear about sleep study. Please advise.

## 2023-06-26 NOTE — Telephone Encounter (Signed)
**Note De-identified Allyn Bartelson Obfuscation**  **Note De-Identified Cherokee Clowers Obfuscation** [facility] Order Request Summary Health Plan: [id number] Start Date: 06/26/2023 Order ID: [id number] Authorized Approval Valid Through:06/26/2023 - 08/24/2023  Member Information: [place] Member #: [id number]  REQUESTED ITEM(S) Exam ID Order Type Sub-Order Type Request Status Reason Action [id number] Diagnostic Sleep Study Devices using peripheral arterial tone (PAT) Authorized Criteria Met   I have provided the pts wife and DPR, Cala Bradford the Pin # of "[id number]" so the pt can proceed with his WatchPat One-HST. She verbalized understanding and is aware to call us if they have any questions or concerns, She did thank me for my call.

## 2023-06-28 ENCOUNTER — Ambulatory Visit (HOSPITAL_COMMUNITY): Payer: BC Managed Care – PPO | Attending: Cardiology

## 2023-06-28 DIAGNOSIS — I10 Essential (primary) hypertension: Secondary | ICD-10-CM | POA: Diagnosis present

## 2023-06-28 DIAGNOSIS — G4733 Obstructive sleep apnea (adult) (pediatric): Secondary | ICD-10-CM | POA: Insufficient documentation

## 2023-06-28 DIAGNOSIS — R600 Localized edema: Secondary | ICD-10-CM | POA: Insufficient documentation

## 2023-06-28 DIAGNOSIS — E785 Hyperlipidemia, unspecified: Secondary | ICD-10-CM | POA: Insufficient documentation

## 2023-06-28 DIAGNOSIS — I509 Heart failure, unspecified: Secondary | ICD-10-CM

## 2023-06-28 LAB — ECHOCARDIOGRAM COMPLETE
Area-P 1/2: 2.66 cm2
S' Lateral: 3.5 cm

## 2023-06-29 ENCOUNTER — Telehealth: Payer: Self-pay | Admitting: Cardiology

## 2023-06-29 NOTE — Telephone Encounter (Signed)
Wife called to follow-up on patient's test results. 

## 2023-07-03 ENCOUNTER — Telehealth: Payer: Self-pay | Admitting: Cardiology

## 2023-07-03 NOTE — Telephone Encounter (Signed)
Results reviewed with patient's wife per DPR as per Dr. Hulen Shouts note on his echocardiogram.   Pt verbalized understanding and had no additional questions. Routed to PCP

## 2023-07-03 NOTE — Telephone Encounter (Signed)
Spoke with Cala Bradford per DPR and advised that Dr. Dulce Sellar was going to recommend to his PCP that he try semaglutide. Will forward last office note to PCP. Cala Bradford verbalized understanding and had no additional questions.

## 2023-07-03 NOTE — Telephone Encounter (Signed)
Pt's wife calling to ask Dr.Munley if the pt can be put on a weight medicine. Please advise

## 2023-07-05 ENCOUNTER — Telehealth: Payer: Self-pay | Admitting: Cardiology

## 2023-07-05 NOTE — Telephone Encounter (Signed)
Patient's spouse is calling back to inform us she misunderstood what the patient was wanting. Patient does not want to see Dr. Dulce Sellar today, he only wants to come in for labs. Patient's wife stated they do not need a call back about this matter.

## 2023-07-05 NOTE — Telephone Encounter (Signed)
Patient's spouses is calling because the patient is scheduled to see Dr. Dulce Sellar on 09/17. Patient is supposed to come by today to have labs drawn and would like to know if Dr. Dulce Sellar would like to see him today.

## 2023-07-06 LAB — BASIC METABOLIC PANEL
BUN/Creatinine Ratio: 19 (ref 10–24)
BUN: 20 mg/dL (ref 8–27)
CO2: 24 mmol/L (ref 20–29)
Calcium: 9.4 mg/dL (ref 8.6–10.2)
Chloride: 98 mmol/L (ref 96–106)
Creatinine, Ser: 1.06 mg/dL (ref 0.76–1.27)
Glucose: 142 mg/dL — ABNORMAL HIGH (ref 70–99)
Potassium: 3.8 mmol/L (ref 3.5–5.2)
Sodium: 139 mmol/L (ref 134–144)
eGFR: 80 mL/min/{1.73_m2} (ref >59)

## 2023-07-09 ENCOUNTER — Telehealth: Payer: Self-pay | Admitting: Cardiology

## 2023-07-09 NOTE — Telephone Encounter (Signed)
Called patient and his wife and they are having a lot of difficulty getting the home sleep study to work. They have already tried to use it multiple times and they are not sure if the sleep study device can be used again. Spoke with Abby CMA and she stated that it sounds like that device is defective. Will speak to the patient to see if they would like to try and do the test with another device.

## 2023-07-09 NOTE — Telephone Encounter (Signed)
Wife states that they having trouble with the home sleep study kit. They are unsure what to do next. Please advise

## 2023-07-10 NOTE — Telephone Encounter (Addendum)
Error

## 2023-07-16 ENCOUNTER — Telehealth: Payer: Self-pay | Admitting: Cardiology

## 2023-07-16 NOTE — Telephone Encounter (Signed)
Patient's wife called wanting to know if tomorrow's appt (9/17) is necessary since all his test came back normal, if not necessary patient would like to cancel test. She also states someone was suppose to get back about a sleep study and they still haven't heard back about it.

## 2023-07-16 NOTE — Progress Notes (Deleted)
Cardiology Office Note:    Date:  07/16/2023   ID:  Rick Sayre., DOB [birth year], MRN [id number]  PCP:  Nonnie Done., MD  Cardiologist:  Norman Herrlich, MD    Referring MD: Nonnie Done., MD    ASSESSMENT:    No diagnosis found. PLAN:    In order of problems listed above:  ***   Next appointment: ***   Medication Adjustments/Labs and Tests Ordered: Current medicines are reviewed at length with the patient today.  Concerns regarding medicines are outlined above.  No orders of the defined types were placed in this encounter.  No orders of the defined types were placed in this encounter.    History of Present Illness:    Rick Blumenschein. is a 61 y.o. male with a hx of hypertension hyperlipidemia sleep apnea morbid obesity with a BMI approaching 45 and alcohol abuse last seen 06/14/2023 with marked edema and weight gain.  He has a history of previous hospitalization with chest pain calcification of the proximal left anterior descending coronary artery and on coronary angiography in July 2016 no significant CAD.  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  He subsequently had an echocardiogram performed today 06/28/2023 showing normal left ventricular systolic function grade 1 diastolic dysfunction without elevated filling pressures normal right ventricular size function no finding of pulmonary artery hypertension. Compliance with diet, lifestyle and medications: *** Past Medical History:  Diagnosis Date   Allergy    Arthritis    "hands get stiff" (05/05/2015)   Cancer of skin, face    "cused cream; froze some off" (05/05/2015)   Depression    Elevated LFTs 10/30/2014   Hypercholesterolemia    Hypertension    Paresthesia    Skull fracture (HCC) ~ 2009   "w/loss of consciousness"   Sleep apnea     "tested positive; working on getting the mask" (05/05/2015)    Current Medications: No outpatient medications have been marked as taking for the 07/17/23 encounter (Appointment) with Baldo Daub, MD.      EKGs/Labs/Other Studies Reviewed:    The following studies were reviewed today:  Cardiac Studies & Procedures   CARDIAC CATHETERIZATION  CARDIAC CATHETERIZATION 05/05/2015  Narrative  No angiographically significant CAD  Minimal proximal LAD calcium  Normal EF, 60%  Reassuring (ST segments sensitive to contrast injection)  Findings Coronary Findings Diagnostic  Dominance: Right  Left Main The vessel was injected is normal in caliber is angiographically normal.  Left Anterior Descending The vessel was injected is normal in caliber is angiographically normal. Mild proximal calcification  Left Circumflex The vessel was injected is normal in caliber is angiographically normal.  Right Coronary Artery The vessel was injected is large is angiographically normal. Slightly anterior takeoff  Intervention  No interventions have been documented.     ECHOCARDIOGRAM  ECHOCARDIOGRAM COMPLETE 06/28/2023  Narrative ECHOCARDIOGRAM REPORT    Patient Name:   Rick Wixson. Date of Exam: 06/28/2023 Medical Rec #:  [id number]              Height:       68.0 in Accession #:    [id number]             Weight:       279.4 lb Date of Birth:  [birth year]  BSA:          2.356 m Patient Age:    62 years               BP:           120/82 mmHg Patient Gender: M                      HR:           72 bpm. Exam Location:  [address]  Procedure: 2D Echo, Cardiac Doppler, Color Doppler and 3D Echo  Indications:    I50.9* Heart failure (unspecified)  History:        Patient has no prior history of Echocardiogram examinations. Abnormal ECG, Signs/Symptoms:Chest Pain; Risk Factors:Hypertension, Dyslipidemia, Sleep Apnea and Smokeless tobacco. Bilateral lower  extremity edema.  Sonographer:    Cathie Beams RCS Referring Phys: [phone] Rick Stokes   Sonographer Comments: Global longitudinal strain was attempted. IMPRESSIONS   1. Left ventricular ejection fraction, by estimation, is 55 to 60%. The left ventricle has normal function. The left ventricle has no regional wall motion abnormalities. Left ventricular diastolic parameters are consistent with Grade I diastolic dysfunction (impaired relaxation). 2. Right ventricular systolic function is normal. The right ventricular size is normal. 3. The mitral valve is normal in structure. Trivial mitral valve regurgitation. No evidence of mitral stenosis. 4. The aortic valve is normal in structure. Aortic valve regurgitation is not visualized. No aortic stenosis is present. 5. Aortic dilatation noted. There is mild dilatation of the ascending aorta, measuring 42 mm. 6. The inferior vena cava is normal in size with greater than 50% respiratory variability, suggesting right atrial pressure of 3 mmHg.  Comparison(s): No prior Echocardiogram.  FINDINGS Left Ventricle: Left ventricular ejection fraction, by estimation, is 55 to 60%. The left ventricle has normal function. The left ventricle has no regional wall motion abnormalities. The left ventricular internal cavity size was normal in size. There is no left ventricular hypertrophy. Left ventricular diastolic parameters are consistent with Grade I diastolic dysfunction (impaired relaxation).  Right Ventricle: The right ventricular size is normal. Right ventricular systolic function is normal.  Left Atrium: Left atrial size was normal in size.  Right Atrium: Right atrial size was normal in size.  Pericardium: There is no evidence of pericardial effusion.  Mitral Valve: The mitral valve is normal in structure. Trivial mitral valve regurgitation. No evidence of mitral valve stenosis.  Tricuspid Valve: The tricuspid valve is normal in structure.  Tricuspid valve regurgitation is trivial. No evidence of tricuspid stenosis.  Aortic Valve: The aortic valve is normal in structure. Aortic valve regurgitation is not visualized. No aortic stenosis is present.  Pulmonic Valve: The pulmonic valve was normal in structure. Pulmonic valve regurgitation is not visualized. No evidence of pulmonic stenosis.  Aorta: Aortic dilatation noted. There is mild dilatation of the ascending aorta, measuring 42 mm.  Venous: The inferior vena cava is normal in size with greater than 50% respiratory variability, suggesting right atrial pressure of 3 mmHg.  IAS/Shunts: No atrial level shunt detected by color flow Doppler.   LEFT VENTRICLE PLAX 2D LVIDd:         4.80 cm   Diastology LVIDs:         3.50 cm   LV e' medial:    6.20 cm/s LV PW:         1.10 cm   LV E/e' medial:  6.6 LV IVS:  1.00 cm   LV e' lateral:   6.73 cm/s LVOT diam:     3.15 cm   LV E/e' lateral: 6.1 LV SV:         126 LV SV Index:   54 LVOT Area:     7.79 cm  3D Volume EF: 3D EF:        53 % LV EDV:       157 ml LV ESV:       74 ml LV SV:        83 ml  RIGHT VENTRICLE RV Basal diam:  3.50 cm RV S prime:     11.70 cm/s TAPSE (M-mode): 1.6 cm  LEFT ATRIUM             Index        RIGHT ATRIUM           Index LA diam:        3.60 cm 1.53 cm/m   RA Area:     14.20 cm LA Vol (A2C):   47.2 ml 20.04 ml/m  RA Volume:   32.20 ml  13.67 ml/m LA Vol (A4C):   40.8 ml 17.32 ml/m LA Biplane Vol: 44.8 ml 19.02 ml/m AORTIC VALVE LVOT Vmax:   73.90 cm/s LVOT Vmean:  50.900 cm/s LVOT VTI:    0.162 m  AORTA Ao Root diam: 3.70 cm  MITRAL VALVE MV Area (PHT): 2.66 cm    SHUNTS MV Decel Time: 285 msec    Systemic VTI:  0.16 m MV E velocity: 41.10 cm/s  Systemic Diam: 3.15 cm MV A velocity: 56.60 cm/s MV E/A ratio:  0.73  Olga Millers MD Electronically signed by Olga Millers MD Signature Date/Time: 06/28/2023/8:38:04 AM    Final                 Recent  Labs: 07/05/2023: BUN 20; Creatinine, Ser 1.06; Potassium 3.8; Sodium 139  Recent Lipid Panel No results found for: "CHOL", "TRIG", "HDL", "CHOLHDL", "VLDL", "LDLCALC", "LDLDIRECT"  Physical Exam:    VS:  There were no vitals taken for this visit.    Wt Readings from Last 3 Encounters:  06/14/23 279 lb 6.4 oz (126.7 kg)  08/17/22 230 lb (104.3 kg)  10/19/21 252 lb (114.3 kg)     GEN: *** Well nourished, well developed in no acute distress HEENT: Normal NECK: No JVD; No carotid bruits LYMPHATICS: No lymphadenopathy CARDIAC: ***RRR, no murmurs, rubs, gallops RESPIRATORY:  Clear to auscultation without rales, wheezing or rhonchi  ABDOMEN: Soft, non-tender, non-distended MUSCULOSKELETAL:  No edema; No deformity  SKIN: Warm and dry NEUROLOGIC:  Alert and oriented x 3 PSYCHIATRIC:  Normal affect    Signed, Norman Herrlich, MD  07/16/2023 12:45 PM    [facility]

## 2023-07-17 ENCOUNTER — Ambulatory Visit: Payer: BC Managed Care – PPO | Admitting: Cardiology

## 2023-07-19 ENCOUNTER — Telehealth: Payer: Self-pay | Admitting: Cardiology

## 2023-07-19 NOTE — Telephone Encounter (Signed)
Wife stated patient has been having sleep apnea equipment issue and she spoke with Casimiro Needle at [place] and he stated patient will need to have orders placed for new machine.

## 2023-08-02 NOTE — Telephone Encounter (Signed)
Left message for the patient to call back.  

## 2023-08-02 NOTE — Telephone Encounter (Signed)
Left message for the patient to call back.

## 2023-08-03 NOTE — Telephone Encounter (Signed)
Called the patient's wife, Rick Stokes and informed her that the 2nd Itamar sleep study was approved. I explained that I had left the device and a copy of the instructions at the front desk for her to pick up. Patient's wife verbalized understanding and had no further questions at this time.

## 2023-08-03 NOTE — Telephone Encounter (Signed)
Called the patient's wife, Cala Bradford and informed her that the 2nd Itamar sleep study was approved. I explained that I had left the device and a copy of the instructions at the front desk for her to pick up. Patient's wife verbalized understanding and had no further questions at this time.

## 2024-08-11 ENCOUNTER — Other Ambulatory Visit (HOSPITAL_BASED_OUTPATIENT_CLINIC_OR_DEPARTMENT_OTHER): Payer: Self-pay | Admitting: Family Medicine

## 2024-08-11 DIAGNOSIS — L02511 Cutaneous abscess of right hand: Secondary | ICD-10-CM

## 2024-11-01 ENCOUNTER — Emergency Department (HOSPITAL_COMMUNITY)
Admission: EM | Admit: 2024-11-01 | Discharge: 2024-11-01 | Disposition: A | Discharge status: Home / Self Care | Attending: Emergency Medicine | Admitting: Emergency Medicine

## 2024-11-01 ENCOUNTER — Encounter (HOSPITAL_COMMUNITY): Payer: Self-pay

## 2024-11-01 DIAGNOSIS — Z85828 Personal history of other malignant neoplasm of skin: Secondary | ICD-10-CM | POA: Insufficient documentation

## 2024-11-01 DIAGNOSIS — I10 Essential (primary) hypertension: Secondary | ICD-10-CM | POA: Insufficient documentation

## 2024-11-01 DIAGNOSIS — M7989 Other specified soft tissue disorders: Secondary | ICD-10-CM | POA: Diagnosis present

## 2024-11-01 DIAGNOSIS — R600 Localized edema: Secondary | ICD-10-CM | POA: Diagnosis not present

## 2024-11-01 DIAGNOSIS — Z79899 Other long term (current) drug therapy: Secondary | ICD-10-CM | POA: Diagnosis not present

## 2024-11-01 DIAGNOSIS — R7401 Elevation of levels of liver transaminase levels: Secondary | ICD-10-CM | POA: Diagnosis not present

## 2024-11-01 DIAGNOSIS — E878 Other disorders of electrolyte and fluid balance, not elsewhere classified: Secondary | ICD-10-CM | POA: Insufficient documentation

## 2024-11-01 LAB — COMPREHENSIVE METABOLIC PANEL WITH GFR
ALT: 39 U/L (ref 0–44)
AST: 48 U/L — ABNORMAL HIGH (ref 15–41)
Albumin: 3.9 g/dL (ref 3.5–5.0)
Alkaline Phosphatase: 91 U/L (ref 38–126)
Anion gap: 12 (ref 5–15)
BUN: 9 mg/dL (ref 8–23)
CO2: 28 mmol/L (ref 22–32)
Calcium: 9.5 mg/dL (ref 8.9–10.3)
Chloride: 97 mmol/L — ABNORMAL LOW (ref 98–111)
Creatinine, Ser: 0.77 mg/dL (ref 0.61–1.24)
GFR, Estimated: >60 mL/min
Glucose, Bld: 143 mg/dL — ABNORMAL HIGH (ref 70–99)
Potassium: 3.7 mmol/L (ref 3.5–5.1)
Sodium: 136 mmol/L (ref 135–145)
Total Bilirubin: 0.7 mg/dL (ref 0.0–1.2)
Total Protein: 7.9 g/dL (ref 6.5–8.1)

## 2024-11-01 LAB — CBC WITH DIFFERENTIAL/PLATELET
Abs Immature Granulocytes: 0.06 K/uL (ref 0.00–0.07)
Basophils Absolute: 0.1 K/uL (ref 0.0–0.1)
Basophils Relative: 1 %
Eosinophils Absolute: 0.2 K/uL (ref 0.0–0.5)
Eosinophils Relative: 2 %
HCT: 42.2 % (ref 39.0–52.0)
Hemoglobin: 14.1 g/dL (ref 13.0–17.0)
Immature Granulocytes: 1 %
Lymphocytes Relative: 16 %
Lymphs Abs: 1.4 K/uL (ref 0.7–4.0)
MCH: 32.5 pg (ref 26.0–34.0)
MCHC: 33.4 g/dL (ref 30.0–36.0)
MCV: 97.2 fL (ref 80.0–100.0)
Monocytes Absolute: 1.1 K/uL — ABNORMAL HIGH (ref 0.1–1.0)
Monocytes Relative: 12 %
Neutro Abs: 6 K/uL (ref 1.7–7.7)
Neutrophils Relative %: 68 %
Platelets: 199 K/uL (ref 150–400)
RBC: 4.34 MIL/uL (ref 4.22–5.81)
RDW: 12.7 % (ref 11.5–15.5)
WBC: 8.7 K/uL (ref 4.0–10.5)
nRBC: 0 % (ref 0.0–0.2)

## 2024-11-01 LAB — PRO BRAIN NATRIURETIC PEPTIDE: Pro Brain Natriuretic Peptide: 177 pg/mL

## 2024-11-01 MED ORDER — FUROSEMIDE 20 MG PO TABS: 20.0000 mg | ORAL_TABLET | Freq: Every day | ORAL | 0 refills | Status: AC

## 2024-11-01 MED ORDER — POTASSIUM CHLORIDE CRYS ER 20 MEQ PO TBCR
40.0000 meq | EXTENDED_RELEASE_TABLET | Freq: Once | ORAL | Status: AC
  Administered 2024-11-01: 40 meq via ORAL
  Filled 2024-11-01: qty 2

## 2024-11-01 MED ORDER — FUROSEMIDE 40 MG PO TABS
20.0000 mg | ORAL_TABLET | Freq: Once | ORAL | Status: AC
  Administered 2024-11-01: 20 mg via ORAL
  Filled 2024-11-01: qty 1

## 2024-11-01 NOTE — Discharge Instructions (Addendum)
 It was a pleasure taking care of you today.  Based on your history and physical exam I feel you are safe for discharge.  Your labs today were reassuring.  Due to your edema I have restarted you on Lasix .  I am putting you on 3 days of Lasix , please take Lasix  as prescribed and follow-up with primary care provider next week.  Lasix  may need to be continued for longer, however your potassium needs to be rechecked.  If you experience any of the following symptoms including but limited to chest pain, shortness of breath, worsening leg swelling, pain, or other concerning symptom please return the emergency department or seek further medical care.  Please follow-up with your primary care provider this upcoming week to discuss if you need to continue your Lasix .  Please continue to take your other medications including spironolactone .  If symptoms worsen recommend follow-up within 48 hours.  Be sure to keep your legs elevated whenever you are not walking and at bed time. Use compression stockings daily. Follow up with your pcp next week for recheck after medication change.   Please return to the emergency department for any worsening or worrisome symptoms. Limit salt intake.

## 2024-11-01 NOTE — ED Provider Notes (Signed)
 " [facility] Provider Note   CSN: [id number] Arrival date & time: 11/01/24  9256     Patient presents with: Leg Swelling   Rick Stokes. is a 64 y.o. male who presents to the ED with a chief complaint of bilateral leg swelling. Patient states that he has been experiencing bilateral leg swelling for approximately a year and a half and has seen his primary care as well as cardiology. States that he was cleared by cardiology and told that he does not have heart failure however swelling has persisted. During initial history patient was unable to tell me his medications, however wife arrived to bedside during encounter and was able to produce medication list. It appears patient is currently taking spironolactone  25mg  daily and is on no other diuretic therapy. Wife states that they had some left over furosemide  20mg  tablets which he has taken the last two days with mild improvement in swelling. Patient was also previously prescribed Torsemide  but is not currently on this medication. Patient denies infectious symptoms like fever, chills, redness or warmth. No known wounds or ulcers. No calf pain or redness. No history of DVT or PE, no DVT risk factors known. Denies chest pain or shortness of breath. Past medical history significant for obstructive sleep apnea, unstable angina, depression, hypercholesterolemia, hypertension, skin cancer, and elevated LFTs. Patient denies trauma/injury to his legs. States that swelling was doing better on his diuretic therapy but states that the is not going to the bathroom as much and has seen an increase in lower extremity swelling and weight gain in general. States that his feet feel tight and it is painful to walk.    HPI     Prior to Admission medications  Medication Sig Start Date End Date Taking? Authorizing Provider  furosemide  (LASIX ) 20 MG tablet Take 1 tablet (20 mg total) by mouth daily. 11/01/24  Yes Athea Haley,  Colen Eltzroth F, PA-C  Acetaminophen  (PAIN RELIEF 8 HOUR PO) Take 2 tablets by mouth daily.    [provider]  amLODipine (NORVASC) 5 MG tablet Take 5 mg by mouth daily.    [provider]  atenolol (TENORMIN) 50 MG tablet Take 50 mg by mouth daily. 02/29/20   [provider]  Cholecalciferol (VITAMIN D ) 2000 UNITS CAPS Take 1 capsule by mouth daily.    [provider]  levothyroxine (SYNTHROID) 25 MCG tablet Take 25 mcg by mouth daily before breakfast.    [provider]  potassium chloride  (KLOR-CON ) 10 MEQ tablet Take 10 mEq by mouth 2 (two) times daily.    [provider]  sertraline (ZOLOFT) 50 MG tablet Take 50 mg by mouth daily.    [provider]  spironolactone  (ALDACTONE ) 25 MG tablet Take 1 tablet (25 mg total) by mouth daily. 06/14/23   Monetta Redell PARAS, MD    Allergies: Bee venom    Review of Systems  Cardiovascular:  Positive for leg swelling (bilateral pitting edema present).    Updated Vital Signs BP (!) 141/82 (BP Location: Left Arm)   Pulse 86   Temp 98.3 F (36.8 C) (Oral)   Resp (!) 22   Ht 5' 8 (1.727 m)   Wt 133.4 kg   SpO2 92%   BMI 44.70 kg/m   Physical Exam Vitals and nursing note reviewed.  Constitutional:      General: He is awake. He is not in acute distress.    Appearance: Normal appearance. He is not  ill-appearing, toxic-appearing or diaphoretic.  HENT:     Head: Normocephalic and atraumatic.  Eyes:     General: No scleral icterus. Cardiovascular:     Rate and Rhythm: Normal rate and regular rhythm.  Pulmonary:     Effort: Pulmonary effort is normal. No respiratory distress.     Breath sounds: No stridor. No wheezing, rhonchi or rales.  Abdominal:     General: Abdomen is flat. There is no distension.     Palpations: Abdomen is soft.     Tenderness: There is no abdominal tenderness.  Musculoskeletal:        General: Normal range of motion.     Right lower leg: Edema present.     Left  lower leg: Edema present.     Comments: Bilateral pitting edema of lower extremities extending down into feet, normal strength of lower extremities, patient ambulatory with walker due to bilateral foot pain  Skin:    General: Skin is warm.     Capillary Refill: Capillary refill takes less than 2 seconds.     Comments: No rashes or lesions  Neurological:     General: No focal deficit present.     Mental Status: He is alert and oriented to person, place, and time.  Psychiatric:        Mood and Affect: Mood normal.        Behavior: Behavior normal. Behavior is cooperative.     (all labs ordered are listed, but only abnormal results are displayed) Labs Reviewed  CBC WITH DIFFERENTIAL/PLATELET - Abnormal; Notable for the following components:      Result Value   Monocytes Absolute 1.1 (*)    All other components within normal limits  COMPREHENSIVE METABOLIC PANEL WITH GFR - Abnormal; Notable for the following components:   Chloride 97 (*)    Glucose, Bld 143 (*)    AST 48 (*)    All other components within normal limits  PRO BRAIN NATRIURETIC PEPTIDE    EKG: None  Radiology: No results found.   Procedures   Medications Ordered in the ED  potassium chloride  SA (KLOR-CON  M) CR tablet 40 mEq (40 mEq Oral Given 11/01/24 1227)  furosemide  (LASIX ) tablet 20 mg (20 mg Oral Given 11/01/24 1228)                                    Medical Decision Making Amount and/or Complexity of Data Reviewed Labs: ordered.  Risk Prescription drug management.   Patient presents to the ED for concern of bilateral foot swelling, this involves an extensive number of treatment options, and is a complaint that carries with it a high risk of complications and morbidity.  The differential diagnosis includes new onset heart failure/exacerbation, cellulitis, DVT, lymphedema, other source of edema, etc.    Co morbidities that complicate the patient evaluation  obstructive sleep apnea, unstable  angina, depression, hypercholesterolemia, hypertension, skin cancer, and elevated LFTs   Additional history obtained:  Reviewed previous cardiology notes from approximately one year ago, at this time appears patient was trialed on Torsemide  therapy, also reviewed most previous echo which showed normal EF (2024)   Lab Tests:  I Ordered, and personally interpreted labs.  The pertinent results include: CBC unremarkable, CMP shows slightly decreased chloride, mildly elevated AST, normal albumin, pro BNP 177   Medicines ordered and prescription drug management:  I ordered medication including lasix  for bilateral edema, potassium Reevaluation  of the patient after these medicines showed that the patient stayed the same I have reviewed the patients home medicines and have made adjustments as needed   Test Considered:  DVT ultrasound: considered however deferred at this time, less likely DVT as presentation is bilateral, no known DVT or PE risk factors, foot swelling has also been present for over a year per patient and wife with improvement and worsening throughout that period, physical exam not consistent with typical DVT presentation CXR: deferred as patient has no chest pain or shortness of breath, no abnormal lung sounds, no hx of CHF   Critical Interventions:  none   Problem List / ED Course:  64 year old male, vital signs stable, presents to the ED with chief complaint of lower extremity swelling. Swelling is chronic in nature and been occurring for approximately 1.5 years however patient states that over the last few days swelling has worsened and he feels his diuretic medication is no longer working, no chest pain or shortness of breath, no hx of heart failure On physical patient is well appearing, bilateral pitting edema present however bilateral lower extremities neurovascularly intact, no abnormal lungs sounds Labs largely reassuring, reviewed previous cardiology notes as well  as last echo Presentation largely not consistent with new onset CHF given no shortness of breath, pro BNP also unremarkable Slight confusion with medication list present as patient is unsure of what medications he is on, wife came to bedside with medication list, only diuretic therapy is currently spironolactone  25mg , patient is not currently on furosemide  or torsemide  therapy. Apparently was previously on Torsemide  therapy through cardiology but this was discontinued. Patient did however have furosemide  tablets left over from previous and took a few doses over the past two days and has had improvement in swelling. Wife states that patient could barely walk yesterday due to foot pain and tightness however today is ambulating better with walker.  Potassium 3.7 so will supplement and plan for 3 days of lasix  therapy, stressed importance of follow-up with PCP  Patient and wife understanding Unknown cause of edema at this time however no evidence of cellulitis or infection, clinically doubt DVT given lack of calf pain, redness and bilateral presentation, and no evidence of new onset CHF/exacerbation Patient is stable and safe for discharge and outpatient follow-up, hopefully a few days of further diuretic therapy will help symptoms, patient understands for further therapy beyond my prescription PCP will have to refill furosemide  and potassium will need to be checked Return precautions given Patient discharged     Reevaluation:  After the interventions noted above, I reevaluated the patient and found that they have :stayed the same   Social Determinants of Health:  none   Dispostion:  After consideration of the diagnostic results and the patients response to treatment, I feel that the patient would benefit from discharge and outpatient therapy as described, follow-up with PCP.      Final diagnoses:  Leg edema    ED Discharge Orders          Ordered    furosemide  (LASIX ) 20 MG tablet   Daily        11/01/24 1231               Janetta Terrall FALCON, PA-C 11/01/24 1839  "

## 2024-11-01 NOTE — ED Triage Notes (Signed)
 Patient c/o BLE swelling/edema Says no history of CHF Swelling x 1 week History of same  Pain rated 2/10 when still, but 7/10 with walking
